# Patient Record
Sex: Female | Born: 1975
Health system: Southern US, Community
[De-identification: ages and names within clinical notes are randomized; demographics above are authoritative.]

## PROBLEM LIST (undated history)

## (undated) DIAGNOSIS — F32A Depression, unspecified: Secondary | ICD-10-CM

## (undated) DIAGNOSIS — R87619 Unspecified abnormal cytological findings in specimens from cervix uteri: Secondary | ICD-10-CM

## (undated) DIAGNOSIS — IMO0002 Reserved for concepts with insufficient information to code with codable children: Secondary | ICD-10-CM

## (undated) HISTORY — DX: Depression, unspecified: F32.A

## (undated) HISTORY — DX: Reserved for concepts with insufficient information to code with codable children: IMO0002

## (undated) HISTORY — DX: Unspecified abnormal cytological findings in specimens from cervix uteri: R87.619

---

## 2004-11-24 ENCOUNTER — Ambulatory Visit: Payer: Self-pay | Admitting: Obstetrics and Gynecology

## 2004-11-24 ENCOUNTER — Ambulatory Visit (HOSPITAL_COMMUNITY): Admission: RE | Admit: 2004-11-24 | Discharge: 2004-11-24 | Payer: Self-pay | Admitting: Obstetrics and Gynecology

## 2004-11-27 ENCOUNTER — Inpatient Hospital Stay (HOSPITAL_COMMUNITY): Admission: AD | Admit: 2004-11-27 | Discharge: 2004-11-28 | Payer: Self-pay | Admitting: Obstetrics and Gynecology

## 2006-01-27 ENCOUNTER — Other Ambulatory Visit: Admission: RE | Admit: 2006-01-27 | Discharge: 2006-01-27 | Payer: Self-pay | Admitting: Obstetrics and Gynecology

## 2007-03-12 ENCOUNTER — Other Ambulatory Visit: Admission: RE | Admit: 2007-03-12 | Discharge: 2007-03-12 | Payer: Self-pay | Admitting: Obstetrics and Gynecology

## 2007-12-17 ENCOUNTER — Encounter: Admission: RE | Admit: 2007-12-17 | Discharge: 2007-12-17 | Payer: Self-pay | Admitting: Obstetrics and Gynecology

## 2008-08-18 ENCOUNTER — Other Ambulatory Visit: Admission: RE | Admit: 2008-08-18 | Discharge: 2008-08-18 | Payer: Self-pay | Admitting: Obstetrics and Gynecology

## 2008-10-27 ENCOUNTER — Encounter: Admission: RE | Admit: 2008-10-27 | Discharge: 2008-10-27 | Payer: Self-pay | Admitting: Cardiology

## 2008-11-04 ENCOUNTER — Ambulatory Visit (HOSPITAL_COMMUNITY): Admission: RE | Admit: 2008-11-04 | Discharge: 2008-11-04 | Payer: Self-pay | Admitting: Cardiology

## 2009-10-05 ENCOUNTER — Other Ambulatory Visit: Admission: RE | Admit: 2009-10-05 | Discharge: 2009-10-05 | Payer: Self-pay | Admitting: Obstetrics and Gynecology

## 2010-07-06 ENCOUNTER — Emergency Department (HOSPITAL_COMMUNITY): Admission: EM | Admit: 2010-07-06 | Discharge: 2010-07-06 | Payer: Self-pay | Admitting: Emergency Medicine

## 2010-11-08 ENCOUNTER — Other Ambulatory Visit
Admission: RE | Admit: 2010-11-08 | Discharge: 2010-11-08 | Payer: Self-pay | Source: Home / Self Care | Admitting: Obstetrics and Gynecology

## 2010-12-18 ENCOUNTER — Encounter: Payer: Self-pay | Admitting: Obstetrics and Gynecology

## 2011-01-18 ENCOUNTER — Ambulatory Visit (HOSPITAL_COMMUNITY)
Admission: RE | Admit: 2011-01-18 | Discharge: 2011-01-18 | Disposition: A | Discharge status: Home / Self Care | Payer: PRIVATE HEALTH INSURANCE | Source: Ambulatory Visit | Attending: Cardiology | Admitting: Cardiology

## 2011-01-18 DIAGNOSIS — Z4509 Encounter for adjustment and management of other cardiac device: Secondary | ICD-10-CM | POA: Insufficient documentation

## 2011-01-18 LAB — PREGNANCY, URINE: Preg Test, Ur: NEGATIVE

## 2011-01-26 NOTE — Op Note (Signed)
  NAMEPATT, STEINHARDT               ACCOUNT NO.:  [id number]  MEDICAL RECORD NO.:  [id number]           PATIENT TYPE:  LOCATION:                                 FACILITY:  PHYSICIAN:  Ritta Slot, MD     DATE OF BIRTH:  1976/09/26  DATE OF PROCEDURE:  01/18/2011 DATE OF DISCHARGE:                              OPERATIVE REPORT   This is a loop recorder explant.  Mia Harris is a very pleasant 35 year old Caucasian female in whom I implanted a loop recorder over a year ago for syncope.  She has had no further syncope.  The loop recorder appears to be uncomfortable and she has had no more syncpoe for over 1 year.  She is therefore here for loop recorder explant.  After informed consent, the left pectoral region was prepped and draped in the usual sterile fashion.  She received 2 mg of Versed, 25 mcg of fentanyl, and 25 mg of Benadryl for IV light sedation.  She received 10 mL of 1% lidocaine for local anesthetic.  A 2-cm incision was made over her prior loop recorder site which was a keloid scar.  Dissection of the loop recorder was performed.  Loop recorder was removed.  Pocket was irrigated with 1% clindamycin solution.  The keloid was then removed by incision and blunt dissection.  The pocket was then closed with 2-0 Monocryl deep interrupted sutures and with 4-0 Monocryl skin continuous suture.  Mepitel strip was then placed over the wound followed by Steri- Strips.  No complications.  She will follow up with me in 1 week.     Ritta Slot, MD     HS/MEDQ  D:  01/18/2011  T:  01/18/2011  Job:  [id number]  Electronically Signed by Ritta Slot MD on 01/26/2011 02:28:15 PM

## 2011-02-11 LAB — GC/CHLAMYDIA PROBE AMP, GENITAL
Chlamydia, DNA Probe: NEGATIVE
GC Probe Amp, Genital: NEGATIVE

## 2011-02-11 LAB — URINALYSIS, ROUTINE W REFLEX MICROSCOPIC
Bilirubin Urine: NEGATIVE
Glucose, UA: NEGATIVE mg/dL
Ketones, ur: NEGATIVE mg/dL
Leukocytes, UA: NEGATIVE
Nitrite: NEGATIVE
Protein, ur: NEGATIVE mg/dL
Specific Gravity, Urine: 1.015 (ref 1.005–1.030)
Urobilinogen, UA: 0.2 mg/dL (ref 0.0–1.0)
pH: 6.5 (ref 5.0–8.0)

## 2011-02-11 LAB — URINE MICROSCOPIC-ADD ON

## 2011-02-11 LAB — POCT PREGNANCY, URINE: Preg Test, Ur: NEGATIVE

## 2011-02-11 LAB — WET PREP, GENITAL
Clue Cells Wet Prep HPF POC: NONE SEEN
Trich, Wet Prep: NONE SEEN
WBC, Wet Prep HPF POC: NONE SEEN
Yeast Wet Prep HPF POC: NONE SEEN

## 2011-04-15 NOTE — H&P (Signed)
Mia Harris, Mia Harris               ACCOUNT NO.:  [id number]   MEDICAL RECORD NO.:  [id number]          PATIENT TYPE:   LOCATION:                                 FACILITY:   PHYSICIAN:  Charles A. Delcambre, MDDATE OF BIRTH:  September 26, 1976   DATE OF ADMISSION:  11/30/2004  DATE OF DISCHARGE:                                HISTORY & PHYSICAL   HISTORY OF PRESENT ILLNESS:  The patient to be admitted November 30, 2004 for  induction at 41 weeks 2 days estimated gestational age.  She is a 35-year-  old para 1-0-1-1 with induction scheduled secondary to postdates.  Pregnancy  has been complicated by bacterial vaginosis treated with Clindesse, marginal  previa with resolved on subsequent ultrasound, with placenta mid position  and posterior.  Placental tip was 5 cm from the cervical os on last  ultrasound at 27 weeks.   PRENATAL LABORATORY DATA:  O positive, antibody screen negative.  VDRL  nonreactive.  Rubella immune.  Hepatitis B surface antigen negative.  HIV  nonreactive.  Urinalysis negative.  Pap negative.  GC and chlamydia  declined.  Quad screen negative.  One-hour Glucola 114.  Hemoglobin 12.3.  Group B strep negative.  Cystic fibrosis negative.   PAST MEDICAL HISTORY:  Negative.   SURGICAL HISTORY:  SVD x1.   MEDICATIONS:  Prenatal vitamins.   ALLERGIES:  PENICILLIN and CODEINE - reaction not specified.   SOCIAL HISTORY:  No tobacco, ethanol, or drug use and the patient is married  in a monogamous relationship with her husband.   FAMILY HISTORY:  Maternal grandmother - lung cancer.  Mother - GYN cancer  not specified otherwise.   REVIEW OF SYSTEMS:  General review negative.   PHYSICAL EXAMINATION:  VITAL SIGNS:  On examination today at 39 weeks 5 days  blood pressure 118/80, fetal heart rate 145, fundal height 40 cm, weight 177  pounds.  HEENT:  Grossly within normal limits.  NECK:  Supple without thyromegaly or adenopathy.  BACK:  NO CVAT.  Vertebral column nontender to  palpation.  HEART:  Regular rate and rhythm, 2/6 systolic ejection murmur left sternal  border.  BREASTS:  Symmetrical, otherwise deferred.  ABDOMEN:  Gravid, fundal height 40 cm, estimated fetal weight 7.5 to 8  pounds, vertex on Leopold's exam.  PELVIC:  Cervix soft, mid plane, 25% effaced, fingertip dilated.  EXTREMITIES:  Minimal edema bilaterally.   ASSESSMENT:  Intrauterine pregnancy, currently 39 weeks 5 days.  Planned to  be induced at 41 weeks 2 days.   PLAN:  High-dose Pitocin, a.m. admission November 30, 2004 at 0530.  All  questions were answered.  Will plan NST, BPP, AFI next week and she will see  Dr. Ashley Royalty in my absence at 41 weeks.  All questions are answered and we  will proceed as outlined.      CAD/MEDQ  D:  11/19/2004  T:  11/19/2004  Job:  [id number]

## 2011-09-02 LAB — BASIC METABOLIC PANEL
BUN: 9 mg/dL (ref 6–23)
CO2: 27 mEq/L (ref 19–32)
Calcium: 9.7 mg/dL (ref 8.4–10.5)
Chloride: 101 mEq/L (ref 96–112)
Creatinine, Ser: 0.72 mg/dL (ref 0.4–1.2)
GFR calc Af Amer: >60 mL/min (ref >60)
GFR calc non Af Amer: >60 mL/min (ref >60)
Glucose, Bld: 99 mg/dL (ref 70–99)
Potassium: 3.8 mEq/L (ref 3.5–5.1)
Sodium: 137 mEq/L (ref 135–145)

## 2011-09-02 LAB — PROTIME-INR
INR: 1 (ref 0.00–1.49)
Prothrombin Time: 13.1 seconds (ref 11.6–15.2)

## 2011-09-02 LAB — CBC
HCT: 38.3 % (ref 36.0–46.0)
Hemoglobin: 13.3 g/dL (ref 12.0–15.0)
MCHC: 34.6 g/dL (ref 30.0–36.0)
MCV: 88.6 fL (ref 78.0–100.0)
Platelets: 330 10*3/uL (ref 150–400)
RBC: 4.33 MIL/uL (ref 3.87–5.11)
RDW: 11.6 % (ref 11.5–15.5)
WBC: 6.3 10*3/uL (ref 4.0–10.5)

## 2011-09-02 LAB — HCG, SERUM, QUALITATIVE: Preg, Serum: NEGATIVE

## 2011-09-02 LAB — APTT: aPTT: 31 seconds (ref 24–37)

## 2012-04-26 ENCOUNTER — Other Ambulatory Visit: Payer: Self-pay | Admitting: Obstetrics & Gynecology

## 2012-04-26 ENCOUNTER — Other Ambulatory Visit (HOSPITAL_COMMUNITY)
Admission: RE | Admit: 2012-04-26 | Discharge: 2012-04-26 | Disposition: A | Discharge status: Home / Self Care | Payer: PRIVATE HEALTH INSURANCE | Source: Ambulatory Visit | Attending: Obstetrics & Gynecology | Admitting: Obstetrics & Gynecology

## 2012-04-26 DIAGNOSIS — Z01419 Encounter for gynecological examination (general) (routine) without abnormal findings: Secondary | ICD-10-CM | POA: Insufficient documentation

## 2013-05-28 ENCOUNTER — Encounter: Payer: Self-pay | Admitting: Obstetrics & Gynecology

## 2013-05-28 ENCOUNTER — Ambulatory Visit (INDEPENDENT_AMBULATORY_CARE_PROVIDER_SITE_OTHER): Payer: BC Managed Care – PPO | Admitting: Obstetrics & Gynecology

## 2013-05-28 VITALS — BP 120/80 | Ht 62.0 in | Wt 155.0 lb

## 2013-05-28 DIAGNOSIS — Z01419 Encounter for gynecological examination (general) (routine) without abnormal findings: Secondary | ICD-10-CM

## 2013-05-28 MED ORDER — NORGESTIMATE-ETH ESTRADIOL 0.25-35 MG-MCG PO TABS: 1.0000 | ORAL_TABLET | Freq: Every day | ORAL | Status: DC

## 2013-05-28 MED ORDER — CLINDAMYCIN PHOSPHATE 1 % EX GEL: Freq: Two times a day (BID) | CUTANEOUS | Status: DC

## 2013-05-28 NOTE — Progress Notes (Signed)
Patient ID: Mia Harris, female   DOB: 12-03-1975, 37 y.o.   MRN: [id number] Subjective:     Mia Harris is a 37 y.o. female here for a routine exam.  Patient's last menstrual period was 05/08/2013. No obstetric history on file. Current complaints: acne .  Personal health questionnaire reviewed: no.   Gynecologic History Patient's last menstrual period was 05/08/2013. Contraception: vasectomy Last Pap: 2013. Results were: normal Last mammogram: na. Results were: na  Obstetric HistoryG3P2 OB History   Grav Para Term Preterm Abortions TAB SAB Ect Mult Living                   The following portions of the patient's history were reviewed and updated as appropriate: allergies, current medications, past family history, past medical history, past social history, past surgical history and problem list.  Review of Systems  Review of Systems  Constitutional: Negative for fever, chills, weight loss, malaise/fatigue and diaphoresis.  HENT: Negative for hearing loss, ear pain, nosebleeds, congestion, sore throat, neck pain, tinnitus and ear discharge.   Eyes: Negative for blurred vision, double vision, photophobia, pain, discharge and redness.  Respiratory: Negative for cough, hemoptysis, sputum production, shortness of breath, wheezing and stridor.   Cardiovascular: Negative for chest pain, palpitations, orthopnea, claudication, leg swelling and PND.  Gastrointestinal: negative for abdominal pain. Negative for heartburn, nausea, vomiting, diarrhea, constipation, blood in stool and melena.  Genitourinary: Negative for dysuria, urgency, frequency, hematuria and flank pain.  Musculoskeletal: Negative for myalgias, back pain, joint pain and falls.  Skin: Negative for itching and rash.  Neurological: Negative for dizziness, tingling, tremors, sensory change, speech change, focal weakness, seizures, loss of consciousness, weakness and headaches.  Endo/Heme/Allergies: Negative for environmental  allergies and polydipsia. Does not bruise/bleed easily.  Psychiatric/Behavioral: Negative for depression, suicidal ideas, hallucinations, memory loss and substance abuse. The patient is not nervous/anxious and does not have insomnia.        Objective:    Physical Exam  Vitals reviewed. Constitutional: She is oriented to person, place, and time. She appears well-developed and well-nourished.  HENT:  Head: Normocephalic and atraumatic.        Right Ear: External ear normal.  Left Ear: External ear normal.  Nose: Nose normal.  Mouth/Throat: Oropharynx is clear and moist.  Eyes: Conjunctivae and EOM are normal. Pupils are equal, round, and reactive to light. Right eye exhibits no discharge. Left eye exhibits no discharge. No scleral icterus.  Neck: Normal range of motion. Neck supple. No tracheal deviation present. No thyromegaly present.  Cardiovascular: Normal rate, regular rhythm, normal heart sounds and intact distal pulses.  Exam reveals no gallop and no friction rub.   No murmur heard. Respiratory: Effort normal and breath sounds normal. No respiratory distress. She has no wheezes. She has no rales. She exhibits no tenderness.  GI: Soft. Bowel sounds are normal. She exhibits no distension and no mass. There is no tenderness. There is no rebound and no guarding.  Genitourinary:       Vulva is normal without lesions Vagina is pink moist without discharge Cervix normal in appearance and pap is done Uterus is normal size shape and contour Adnexa is negative with normal sized ovaries   Musculoskeletal: Normal range of motion. She exhibits no edema and no tenderness.  Neurological: She is alert and oriented to person, place, and time. She has normal reflexes. She displays normal reflexes. No cranial nerve deficit. She exhibits normal muscle tone. Coordination normal.  Skin: Skin is warm  and dry. No rash noted. No erythema. No pallor.  Psychiatric: She has a normal mood and affect. Her  behavior is normal. Judgment and thought content normal.       Assessment:    Healthy female exam.    Plan:    Follow up in: 1 year. Clindagel and OCP

## 2013-05-28 NOTE — Patient Instructions (Signed)

## 2013-06-03 ENCOUNTER — Ambulatory Visit: Payer: BC Managed Care – PPO | Admitting: Family Medicine

## 2013-07-24 ENCOUNTER — Other Ambulatory Visit: Payer: Self-pay | Admitting: *Deleted

## 2013-07-24 MED ORDER — NORGESTIMATE-ETH ESTRADIOL 0.25-35 MG-MCG PO TABS: 1.0000 | ORAL_TABLET | Freq: Every day | ORAL | Status: DC

## 2013-07-26 ENCOUNTER — Other Ambulatory Visit: Payer: Self-pay | Admitting: *Deleted

## 2013-08-05 ENCOUNTER — Telehealth: Payer: Self-pay | Admitting: *Deleted

## 2013-08-05 MED ORDER — NORGESTIMATE-ETH ESTRADIOL 0.25-35 MG-MCG PO TABS: 1.0000 | ORAL_TABLET | Freq: Every day | ORAL | Status: DC

## 2013-08-05 NOTE — Telephone Encounter (Signed)
sprintec refilled.

## 2014-07-11 ENCOUNTER — Encounter: Payer: Self-pay | Admitting: Family Medicine

## 2014-07-11 ENCOUNTER — Ambulatory Visit (INDEPENDENT_AMBULATORY_CARE_PROVIDER_SITE_OTHER): Payer: BC Managed Care – PPO | Admitting: Family Medicine

## 2014-07-11 VITALS — BP 110/74 | HR 78 | Temp 98.3°F | Resp 12 | Ht 64.5 in | Wt 144.0 lb

## 2014-07-11 DIAGNOSIS — R42 Dizziness and giddiness: Secondary | ICD-10-CM

## 2014-07-11 LAB — CBC WITH DIFFERENTIAL/PLATELET
Basophils Absolute: 0 10*3/uL (ref 0.0–0.1)
Basophils Relative: 0 % (ref 0–1)
EOS PCT: 1 % (ref 0–5)
Eosinophils Absolute: 0.1 10*3/uL (ref 0.0–0.7)
HCT: 38.1 % (ref 36.0–46.0)
Hemoglobin: 13 g/dL (ref 12.0–15.0)
Lymphocytes Relative: 25 % (ref 12–46)
Lymphs Abs: 1.6 10*3/uL (ref 0.7–4.0)
MCH: 29.3 pg (ref 26.0–34.0)
MCHC: 34.1 g/dL (ref 30.0–36.0)
MCV: 85.8 fL (ref 78.0–100.0)
MONOS PCT: 9 % (ref 3–12)
Monocytes Absolute: 0.6 10*3/uL (ref 0.1–1.0)
Neutro Abs: 4.1 10*3/uL (ref 1.7–7.7)
Neutrophils Relative %: 65 % (ref 43–77)
PLATELETS: 252 10*3/uL (ref 150–400)
RBC: 4.44 MIL/uL (ref 3.87–5.11)
RDW: 13.7 % (ref 11.5–15.5)
WBC: 6.3 10*3/uL (ref 4.0–10.5)

## 2014-07-11 LAB — COMPLETE METABOLIC PANEL WITH GFR
ALK PHOS: 65 U/L (ref 39–117)
ALT: 15 U/L (ref 0–35)
AST: 13 U/L (ref 0–37)
Albumin: 4.5 g/dL (ref 3.5–5.2)
BUN: 12 mg/dL (ref 6–23)
CO2: 26 mEq/L (ref 19–32)
CREATININE: 0.77 mg/dL (ref 0.50–1.10)
Calcium: 9.8 mg/dL (ref 8.4–10.5)
Chloride: 105 mEq/L (ref 96–112)
GFR, Est African American: >89 mL/min
GFR, Est Non African American: >89 mL/min
Glucose, Bld: 88 mg/dL (ref 70–99)
Potassium: 4.5 mEq/L (ref 3.5–5.3)
SODIUM: 140 meq/L (ref 135–145)
Total Bilirubin: 0.4 mg/dL (ref 0.2–1.2)
Total Protein: 7.3 g/dL (ref 6.0–8.3)

## 2014-07-11 LAB — TSH: TSH: 1.869 u[IU]/mL (ref 0.350–4.500)

## 2014-07-11 NOTE — Progress Notes (Signed)
Subjective:    Patient ID: Mia GreathouseMisty S Ayre, female    DOB: 03-01-1976, 38 y.o.   MRN: [id number]  HPI Patient is a very pleasant 38 year old white female who presents with one month of episodic disequilibrium. There may be some symptoms. She does occasionally have a rotational feel that episode. However there is no position change it seems to trigger it. The symptoms do come and go. There are no exacerbating or alleviating factors. It is definitely not related to position changes. She denies any headaches. She denies any blurred vision. She denies any neurologic changes. She does mix his short-term memory loss such as forgetting peoples names however does not seem to be severe memory loss. She denies any seizure activity or other neurologic deficits. She denies any tinnitus. She denies any hearing loss. She denies any sinus problems. I question the patient very extensively regarding cardiac risk factors. She denies any orthostatic dizziness. She denies any chest pain or shortness of breath. She denies any palpitations. She is adamant that this is not presyncope or syncope. Past Medical History  Diagnosis Date  . Abnormal Pap smear     abnormal pap   No past surgical history on file. No current outpatient prescriptions on file prior to visit.   No current facility-administered medications on file prior to visit.   Allergies  Allergen Reactions  . Penicillins Itching and Rash   History   Social History  . Marital Status: Married    Spouse Name: N/A    Number of Children: N/A  . Years of Education: N/A   Occupational History  . Not on file.   Social History Main Topics  . Smoking status: Former Games developermoker  . Smokeless tobacco: Not on file  . Alcohol Use: Not on file  . Drug Use: Not on file  . Sexual Activity: Yes   Other Topics Concern  . Not on file   Social History Narrative  . No narrative on file      Review of Systems  All other systems reviewed and are  negative.      Objective:   Physical Exam  Vitals reviewed. Constitutional: She is oriented to person, place, and time. She appears well-developed and well-nourished. No distress.  HENT:  Right Ear: External ear normal.  Left Ear: External ear normal.  Nose: Nose normal.  Mouth/Throat: Oropharynx is clear and moist. No oropharyngeal exudate.  Eyes: Conjunctivae and EOM are normal. Pupils are equal, round, and reactive to light. No scleral icterus.  Neck: Neck supple. No thyromegaly present.  Cardiovascular: Normal rate, regular rhythm, normal heart sounds and intact distal pulses.  Exam reveals no gallop and no friction rub.   No murmur heard. Pulmonary/Chest: Effort normal and breath sounds normal. No respiratory distress. She has no wheezes. She has no rales. She exhibits no tenderness.  Abdominal: Soft. Bowel sounds are normal. She exhibits no distension and no mass. There is no tenderness. There is no rebound and no guarding.  Lymphadenopathy:    She has no cervical adenopathy.  Neurological: She is alert and oriented to person, place, and time. She has normal reflexes. She displays normal reflexes. No cranial nerve deficit. She exhibits normal muscle tone. Coordination normal.  Skin: She is not diaphoretic.          Assessment & Plan:  1. Dizziness and giddiness Patient's physical exam is completely normal. I do not believe the patient is experiencing vertigo. I do not believe the patient is experiencing presyncope. I  believe the patient is experiencing disequilibrium which usually is benign. Present time I see no indication for head CT. I will start with a CBC as well as CMP and TSH. If lab work is normal I recommend clinical monitoring for the next few weeks. If symptoms persist our next visit with a CT scan of the head and also possibly a cardiac monitor to rule out cardiac arrhythmias. - CBC with Differential - COMPLETE METABOLIC PANEL WITH GFR - TSH

## 2014-08-01 ENCOUNTER — Encounter: Payer: BC Managed Care – PPO | Admitting: Family Medicine

## 2014-08-22 ENCOUNTER — Ambulatory Visit (INDEPENDENT_AMBULATORY_CARE_PROVIDER_SITE_OTHER): Payer: BC Managed Care – PPO | Admitting: Family Medicine

## 2014-08-22 ENCOUNTER — Encounter: Payer: Self-pay | Admitting: Family Medicine

## 2014-08-22 VITALS — BP 104/70 | HR 72 | Temp 98.0°F | Resp 18 | Ht 64.25 in | Wt 145.0 lb

## 2014-08-22 DIAGNOSIS — Z0000 Encounter for general adult medical examination without abnormal findings: Secondary | ICD-10-CM

## 2014-08-22 NOTE — Progress Notes (Signed)
Subjective:    Patient ID: Mia Harris, female    DOB: 30-Sep-1976, 37 y.o.   MRN: [id number]  HPI  Patient is here today for complete physical exam. The symptoms she was having back in August have improved. Still occasionally gets lightheaded and feels that she may pass out. She is not ago for tilt table test yet cardiac monitor. She believes the symptoms are improving. Otherwise she is doing well. She is scheduled to see a gynecologist for her breast exam and Pap smear. I reviewed all her lab work from August which showed a normal CBC, normal CMP, normal TSH. She has not had a fasting lipid panel. She declines a flu shot today. Past Medical History  Diagnosis Date  . Abnormal Pap smear     abnormal pap   No past surgical history on file. No current outpatient prescriptions on file prior to visit.   No current facility-administered medications on file prior to visit.   Allergies  Allergen Reactions  . Codeine Rash  . Penicillins Itching and Rash   History   Social History  . Marital Status: Married    Spouse Name: N/A    Number of Children: N/A  . Years of Education: N/A   Occupational History  . Not on file.   Social History Main Topics  . Smoking status: Former Games developer  . Smokeless tobacco: Never Used  . Alcohol Use: Yes  . Drug Use: No  . Sexual Activity: Yes   Other Topics Concern  . Not on file   Social History Narrative  . No narrative on file   No family history on file. Mom has severe COPD  Review of Systems  All other systems reviewed and are negative.      Objective:   Physical Exam  Vitals reviewed. Constitutional: She is oriented to person, place, and time. She appears well-developed and well-nourished. No distress.  HENT:  Head: Normocephalic and atraumatic.  Right Ear: External ear normal.  Left Ear: External ear normal.  Nose: Nose normal.  Mouth/Throat: Oropharynx is clear and moist. No oropharyngeal exudate.  Eyes: Conjunctivae  and EOM are normal. Pupils are equal, round, and reactive to light. Right eye exhibits no discharge. Left eye exhibits no discharge. No scleral icterus.  Neck: Normal range of motion. Neck supple. No JVD present. No tracheal deviation present. No thyromegaly present.  Cardiovascular: Normal rate, regular rhythm, normal heart sounds and intact distal pulses.  Exam reveals no gallop and no friction rub.   No murmur heard. Pulmonary/Chest: Effort normal and breath sounds normal. No stridor. No respiratory distress. She has no wheezes. She has no rales. She exhibits no tenderness.  Abdominal: Soft. Bowel sounds are normal. She exhibits no distension and no mass. There is no tenderness. There is no rebound and no guarding.  Musculoskeletal: Normal range of motion. She exhibits no edema and no tenderness.  Lymphadenopathy:    She has no cervical adenopathy.  Neurological: She is alert and oriented to person, place, and time. She has normal reflexes. She displays normal reflexes. No cranial nerve deficit. She exhibits normal muscle tone. Coordination normal.  Skin: Skin is warm. No rash noted. She is not diaphoretic. No erythema. No pallor.  Psychiatric: She has a normal mood and affect. Her behavior is normal. Judgment and thought content normal.          Assessment & Plan:  Routine general medical examination at a health care facility - Plan: Lipid panel  Patient's physical exam today is completely normal. I recommended she see her gynecologist for her breast exam as well as her Pap smear. Her lab work is excellent. I like her to her fasting for fasting lipid panel. She continues to have presyncopal episodes, I have recommended a tilt table test as well as a one-month loop recorder. Recommended a flu shot but she declined that. Otherwise her physical exam is completely normal.

## 2015-03-06 ENCOUNTER — Other Ambulatory Visit (HOSPITAL_COMMUNITY)
Admission: RE | Admit: 2015-03-06 | Discharge: 2015-03-06 | Disposition: A | Discharge status: Home / Self Care | Payer: BLUE CROSS/BLUE SHIELD | Source: Ambulatory Visit | Attending: Obstetrics & Gynecology | Admitting: Obstetrics & Gynecology

## 2015-03-06 ENCOUNTER — Ambulatory Visit (INDEPENDENT_AMBULATORY_CARE_PROVIDER_SITE_OTHER): Payer: BLUE CROSS/BLUE SHIELD | Admitting: Obstetrics & Gynecology

## 2015-03-06 ENCOUNTER — Encounter: Payer: Self-pay | Admitting: Obstetrics & Gynecology

## 2015-03-06 VITALS — BP 110/80 | HR 72 | Ht 64.0 in | Wt 143.4 lb

## 2015-03-06 DIAGNOSIS — Z01419 Encounter for gynecological examination (general) (routine) without abnormal findings: Secondary | ICD-10-CM | POA: Diagnosis not present

## 2015-03-06 DIAGNOSIS — Z1151 Encounter for screening for human papillomavirus (HPV): Secondary | ICD-10-CM | POA: Insufficient documentation

## 2015-03-06 DIAGNOSIS — N763 Subacute and chronic vulvitis: Secondary | ICD-10-CM | POA: Diagnosis not present

## 2015-03-06 MED ORDER — METRONIDAZOLE 0.75 % VA GEL: VAGINAL | Status: DC

## 2015-03-06 NOTE — Progress Notes (Signed)
Patient ID: Mia Harris, female   DOB: 17-Jan-1976, 39 y.o.   MRN: [id number] Subjective:     Mia Harris is a 39 y.o. female here for a routine exam.  Patient's last menstrual period was 02/17/2015. No obstetric history on file. Birth Control Method:  vasectomy Menstrual Calendar(currently): regular, second day painful  Current complaints: acne.   Current acute medical issues:  none   Recent Gynecologic History Patient's last menstrual period was 02/17/2015. Last Pap: 2014,  normal Last mammogram: ,    Past Medical History  Diagnosis Date  . Abnormal Pap smear     abnormal pap    History reviewed. No pertinent past surgical history.  OB History    No data available      History   Social History  . Marital Status: Married    Spouse Name: N/A  . Number of Children: N/A  . Years of Education: N/A   Social History Main Topics  . Smoking status: Former Games developer  . Smokeless tobacco: Never Used  . Alcohol Use: Yes  . Drug Use: No  . Sexual Activity: Yes   Other Topics Concern  . None   Social History Narrative    Family History  Problem Relation Age of Onset  . Cancer - Lung Maternal Grandmother   . Endometriosis Mother   . Cancer - Lung Maternal Aunt      Current outpatient prescriptions:  .  calcium carbonate 200 MG capsule, Take 600 mg by mouth daily., Disp: , Rfl:  .  Doxycycline Hyclate 150 MG TABS, Take by mouth., Disp: , Rfl:  .  Multiple Vitamin (MULTIVITAMIN) tablet, Take 1 tablet by mouth daily., Disp: , Rfl:  .  BIOTIN PO, Take 1 tablet by mouth daily., Disp: , Rfl:  .  Cyanocobalamin (B-12 PO), Take 1 tablet by mouth daily., Disp: , Rfl:  .  Omega-3 Fatty Acids (FISH OIL) 1000 MG CAPS, Take 1 capsule by mouth daily., Disp: , Rfl:   Review of Systems  Review of Systems  Constitutional: Negative for fever, chills, weight loss, malaise/fatigue and diaphoresis.  HENT: Negative for hearing loss, ear pain, nosebleeds, congestion, sore throat,  neck pain, tinnitus and ear discharge.   Eyes: Negative for blurred vision, double vision, photophobia, pain, discharge and redness.  Respiratory: Negative for cough, hemoptysis, sputum production, shortness of breath, wheezing and stridor.   Cardiovascular: Negative for chest pain, palpitations, orthopnea, claudication, leg swelling and PND.  Gastrointestinal: negative for abdominal pain. Negative for heartburn, nausea, vomiting, diarrhea, constipation, blood in stool and melena.  Genitourinary: Negative for dysuria, urgency, frequency, hematuria and flank pain.  Musculoskeletal: Negative for myalgias, back pain, joint pain and falls.  Skin: Negative for itching and rash.  Neurological: Negative for dizziness, tingling, tremors, sensory change, speech change, focal weakness, seizures, loss of consciousness, weakness and headaches.  Endo/Heme/Allergies: Negative for environmental allergies and polydipsia. Does not bruise/bleed easily.  Psychiatric/Behavioral: Negative for depression, suicidal ideas, hallucinations, memory loss and substance abuse. The patient is not nervous/anxious and does not have insomnia.        Objective:  Blood pressure 110/80, pulse 72, height  (1.626 m), weight 143 lb 6.4 oz (65.046 kg), last menstrual period 02/17/2015.   Physical Exam  Vitals reviewed. Constitutional: She is oriented to person, place, and time. She appears well-developed and well-nourished.  HENT:  Head: Normocephalic and atraumatic.        Right Ear: External ear normal.  Left Ear: External ear normal.  Nose: Nose normal.  Mouth/Throat: Oropharynx is clear and moist.  Eyes: Conjunctivae and EOM are normal. Pupils are equal, round, and reactive to light. Right eye exhibits no discharge. Left eye exhibits no discharge. No scleral icterus.  Neck: Normal range of motion. Neck supple. No tracheal deviation present. No thyromegaly present.  Cardiovascular: Normal rate, regular rhythm, normal  heart sounds and intact distal pulses.  Exam reveals no gallop and no friction rub.   No murmur heard. Respiratory: Effort normal and breath sounds normal. No respiratory distress. She has no wheezes. She has no rales. She exhibits no tenderness.  GI: Soft. Bowel sounds are normal. She exhibits no distension and no mass. There is no tenderness. There is no rebound and no guarding.  Genitourinary:  Breasts no masses skin changes or nipple changes bilaterally      Vulva is normal without lesions Vagina is pink moist with discharge, see wet prep results Cervix normal in appearance and pap is done Uterus is normal size shape and contour Adnexa is negative with normal sized ovaries   Musculoskeletal: Normal range of motion. She exhibits no edema and no tenderness.  Neurological: She is alert and oriented to person, place, and time. She has normal reflexes. She displays normal reflexes. No cranial nerve deficit. She exhibits normal muscle tone. Coordination normal.  Skin: Skin is warm and dry. No rash noted. No erythema. No pallor.  Psychiatric: She has a normal mood and affect. Her behavior is normal. Judgment and thought content normal.    Wet Prep:   A sample of vaginal discharge was obtained from the posterior fornix using a cotton swab. 2 drops of saline were placed on a slide and the cotton swab was immersed in the saline. Microscopic evaluation was performed and results were as follows:  Negative  for yeast  Positive for clue cells , consistent with Bacterial vaginosis Negative for trichomonas  Normal WBC population   Whiff test: Positive      Assessment:    Healthy female exam.   + BV Plan:    Follow up in: 1 years. metro Gel qhs x 5

## 2015-03-09 LAB — CYTOLOGY - PAP

## 2015-11-19 ENCOUNTER — Encounter: Payer: Self-pay | Admitting: Obstetrics & Gynecology

## 2015-11-19 ENCOUNTER — Ambulatory Visit (INDEPENDENT_AMBULATORY_CARE_PROVIDER_SITE_OTHER): Payer: BLUE CROSS/BLUE SHIELD | Admitting: Obstetrics & Gynecology

## 2015-11-19 VITALS — BP 124/70 | HR 68 | Ht 64.0 in | Wt 145.0 lb

## 2015-11-19 DIAGNOSIS — L700 Acne vulgaris: Secondary | ICD-10-CM

## 2015-11-19 MED ORDER — DESOGESTREL-ETHINYL ESTRADIOL 0.15-30 MG-MCG PO TABS: 1.0000 | ORAL_TABLET | Freq: Every day | ORAL | Status: DC

## 2016-01-03 NOTE — Progress Notes (Signed)
Patient ID: Mia Harris, female   DOB: [birth year], 40 y.o.   MRN: [id number]   Chief Complaint  Patient presents with  . discuss birth control for Acne    Blood pressure 124/70, pulse 68, height  (1.626 m), weight 145 lb (65.772 kg), last menstrual period 11/16/2015.   Patient presents with increasing problems with her acne Cystic on her face cephalically in the chin area Also some on the back and low in the chest Definitely dramatically worsens around the time of her period  Exam is consistent with her history  We discussed at length topical measures including keratin a lytic's moisturizers and all peroxide and of course use of birth control  She's placed on birth control pills a day and we'll see her back in 6 months for follow-up all questions were answered  Meds ordered this encounter  Medications  . desogestrel-ethinyl estradiol (APRI,EMOQUETTE,SOLIA) 0.15-30 MG-MCG tablet    Sig: Take 1 tablet by mouth daily.    Dispense:  1 Package    Refill:  11     ICD-9-CM ICD-10-CM   1. Cystic acne 706.1 L70.0       Face to face time:  15 minutes  Greater than 50% of the visit time was spent in counseling and coordination of care with the patient.  The summary and outline of the counseling and care coordination is summarized in the note above.   All questions were answered.

## 2016-01-07 ENCOUNTER — Ambulatory Visit (INDEPENDENT_AMBULATORY_CARE_PROVIDER_SITE_OTHER): Payer: BLUE CROSS/BLUE SHIELD | Admitting: Family Medicine

## 2016-01-07 ENCOUNTER — Encounter: Payer: Self-pay | Admitting: Family Medicine

## 2016-01-07 VITALS — BP 112/64 | HR 80 | Temp 98.3°F | Resp 16 | Ht 64.0 in | Wt 149.0 lb

## 2016-01-07 DIAGNOSIS — Z13220 Encounter for screening for lipoid disorders: Secondary | ICD-10-CM | POA: Diagnosis not present

## 2016-01-07 DIAGNOSIS — H8110 Benign paroxysmal vertigo, unspecified ear: Secondary | ICD-10-CM | POA: Diagnosis not present

## 2016-01-07 LAB — CBC WITH DIFFERENTIAL/PLATELET
BASOS PCT: 0 % (ref 0–1)
Basophils Absolute: 0 10*3/uL (ref 0.0–0.1)
EOS PCT: 1 % (ref 0–5)
Eosinophils Absolute: 0.1 10*3/uL (ref 0.0–0.7)
HCT: 39.6 % (ref 36.0–46.0)
HEMOGLOBIN: 13.3 g/dL (ref 12.0–15.0)
Lymphocytes Relative: 18 % (ref 12–46)
Lymphs Abs: 1 10*3/uL (ref 0.7–4.0)
MCH: 30.4 pg (ref 26.0–34.0)
MCHC: 33.6 g/dL (ref 30.0–36.0)
MCV: 90.4 fL (ref 78.0–100.0)
MPV: 10.2 fL (ref 8.6–12.4)
Monocytes Absolute: 0.7 10*3/uL (ref 0.1–1.0)
Monocytes Relative: 12 % (ref 3–12)
NEUTROS ABS: 3.9 10*3/uL (ref 1.7–7.7)
Neutrophils Relative %: 69 % (ref 43–77)
Platelets: 202 10*3/uL (ref 150–400)
RBC: 4.38 MIL/uL (ref 3.87–5.11)
RDW: 13.2 % (ref 11.5–15.5)
WBC: 5.7 10*3/uL (ref 4.0–10.5)

## 2016-01-07 LAB — COMPLETE METABOLIC PANEL WITH GFR
ALT: 10 U/L (ref 6–29)
AST: 16 U/L (ref 10–30)
Albumin: 4 g/dL (ref 3.6–5.1)
Alkaline Phosphatase: 54 U/L (ref 33–115)
BUN: 12 mg/dL (ref 7–25)
CHLORIDE: 104 mmol/L (ref 98–110)
CO2: 20 mmol/L (ref 20–31)
Calcium: 8.9 mg/dL (ref 8.6–10.2)
Creat: 0.81 mg/dL (ref 0.50–1.10)
GFR, Est African American: >89 mL/min (ref >=60)
GFR, Est Non African American: >89 mL/min (ref >=60)
GLUCOSE: 83 mg/dL (ref 70–99)
Potassium: 4.4 mmol/L (ref 3.5–5.3)
SODIUM: 139 mmol/L (ref 135–146)
Total Bilirubin: 0.5 mg/dL (ref 0.2–1.2)
Total Protein: 6.7 g/dL (ref 6.1–8.1)

## 2016-01-07 LAB — LIPID PANEL
CHOLESTEROL: 158 mg/dL (ref 125–200)
HDL: 53 mg/dL (ref >=46)
LDL CALC: 77 mg/dL (ref <130)
Total CHOL/HDL Ratio: 3 Ratio (ref <=5.0)
Triglycerides: 142 mg/dL (ref <150)
VLDL: 28 mg/dL (ref <30)

## 2016-01-07 NOTE — Progress Notes (Signed)
Subjective:    Patient ID: Mia Harris, female    DOB: 1976-09-18, 40 y.o.   MRN: [id number]  HPI 06/2014 Patient is a very pleasant 39 year old white female who presents with one month of episodic disequilibrium. There may be some symptoms. She does occasionally have a rotational feel that episode. However there is no position change it seems to trigger it. The symptoms do come and go. There are no exacerbating or alleviating factors. It is definitely not related to position changes. She denies any headaches. She denies any blurred vision. She denies any neurologic changes. She does mix his short-term memory loss such as forgetting peoples names however does not seem to be severe memory loss. She denies any seizure activity or other neurologic deficits. She denies any tinnitus. She denies any hearing loss. She denies any sinus problems. I question the patient very extensively regarding cardiac risk factors. She denies any orthostatic dizziness. She denies any chest pain or shortness of breath. She denies any palpitations. She is adamant that this is not presyncope or syncope.  AT that time, my plan was: 1. Dizziness and giddiness Patient's physical exam is completely normal. I do not believe the patient is experiencing vertigo. I do not believe the patient is experiencing presyncope. I believe the patient is experiencing disequilibrium which usually is benign. Present time I see no indication for head CT. I will start with a CBC as well as CMP and TSH. If lab work is normal I recommend clinical monitoring for the next few weeks. If symptoms persist our next visit with a CT scan of the head and also possibly a cardiac monitor to rule out cardiac arrhythmias. - CBC with Differential - COMPLETE METABOLIC PANEL WITH GFR - TSH  01/07/16  she's been doing well up until this week. This week she had 2 days of dizziness. The dizziness had a vertiginous component to it. If she turns her head rapidly looking  side to side , she would feel very lightheaded. She describes it as the room beginning to move her spend. She denies any hearing loss. She denies any tinnitus. She denies any headaches. She denies any blurry vision. She denies any neurologic deficits anywhere else in the body. She denies any neuropathic pain or numbness and tingling in her extremities. She denies any heavy bleeding. She is on birth control and she states that she cannot be pregnant. She is not taking any medications that can make her dizzy. Today she feels much better. Her exam today is completely normal.   In retrospect, she states that she has been having some mild dizziness occasionally over the last 6 months but nothing as severe as the last 2 days this week. Furthermore the spells would come and go very infrequently.   Past Medical History  Diagnosis Date  . Abnormal Pap smear     abnormal pap   No past surgical history on file. Current Outpatient Prescriptions on File Prior to Visit  Medication Sig Dispense Refill  . calcium carbonate 200 MG capsule Take 600 mg by mouth daily.    Marland Kitchen desogestrel-ethinyl estradiol (APRI,EMOQUETTE,SOLIA) 0.15-30 MG-MCG tablet Take 1 tablet by mouth daily. 1 Package 11  . Multiple Vitamin (MULTIVITAMIN) tablet Take 1 tablet by mouth daily.     No current facility-administered medications on file prior to visit.   Allergies  Allergen Reactions  . Codeine Rash  . Penicillins Itching and Rash   Social History   Social History  . Marital Status:  Married    Spouse Name: N/A  . Number of Children: N/A  . Years of Education: N/A   Occupational History  . Not on file.   Social History Main Topics  . Smoking status: Former Games developer  . Smokeless tobacco: Never Used  . Alcohol Use: Yes  . Drug Use: No  . Sexual Activity: Yes   Other Topics Concern  . Not on file   Social History Narrative      Review of Systems  All other systems reviewed and are negative.      Objective:    Physical Exam  Constitutional: She is oriented to person, place, and time. She appears well-developed and well-nourished. No distress.  HENT:  Right Ear: External ear normal.  Left Ear: External ear normal.  Nose: Nose normal.  Mouth/Throat: Oropharynx is clear and moist. No oropharyngeal exudate.  Eyes: Conjunctivae and EOM are normal. Pupils are equal, round, and reactive to light. No scleral icterus.  Neck: Neck supple. No thyromegaly present.  Cardiovascular: Normal rate, regular rhythm, normal heart sounds and intact distal pulses.  Exam reveals no gallop and no friction rub.   No murmur heard. Pulmonary/Chest: Effort normal and breath sounds normal. No respiratory distress. She has no wheezes. She has no rales. She exhibits no tenderness.  Abdominal: Soft. Bowel sounds are normal. She exhibits no distension and no mass. There is no tenderness. There is no rebound and no guarding.  Lymphadenopathy:    She has no cervical adenopathy.  Neurological: She is alert and oriented to person, place, and time. She has normal reflexes. No cranial nerve deficit. She exhibits normal muscle tone. Coordination normal.  Skin: She is not diaphoretic.  Vitals reviewed.         Assessment & Plan:  Benign paroxysmal positional vertigo, unspecified laterality - Plan: CBC with Differential/Platelet, COMPLETE METABOLIC PANEL WITH GFR  Screening cholesterol level - Plan: Lipid panel   exam today is completely reassuring. I suspect that she may be having some mild vertigo versus viral labyrinthitis. There are no abnormalities seen on examination today. Today she feels completely normal. Therefore we have elected to monitor her situation clinically. Should symptoms worsen or persist , she would like to proceed with an MRI of the brain.Marland Kitchen

## 2016-01-08 ENCOUNTER — Encounter: Payer: Self-pay | Admitting: Family Medicine

## 2016-01-19 ENCOUNTER — Other Ambulatory Visit: Payer: Self-pay | Admitting: *Deleted

## 2016-01-19 MED ORDER — DESOGESTREL-ETHINYL ESTRADIOL 0.15-30 MG-MCG PO TABS: 1.0000 | ORAL_TABLET | Freq: Every day | ORAL | Status: DC

## 2016-01-19 NOTE — Telephone Encounter (Signed)
Medication sent to pt's pharmacy with 90 day supply per Dr. Despina Hidden.

## 2016-04-15 ENCOUNTER — Telehealth: Payer: Self-pay | Admitting: Obstetrics & Gynecology

## 2016-04-15 NOTE — Telephone Encounter (Signed)
Pt states the Apri OCP is increasing her appetite/weight gain. Pt states is there an alternative?

## 2016-04-15 NOTE — Telephone Encounter (Signed)
The progesterone component of birth control pills is what sometimes can cause some weight gain, on average 3-5 pounds of fluid retention.  Apri has desogestrel as its progesterone which is a 3rd generation progesterone meaning it is causes less of this than other progesterones.  The only one that causes less is drosperinone which we do not prescribe because of its significant increase in blood clots.  So long winded way to say she is on an optimal pill for weight gain.

## 2016-04-15 NOTE — Telephone Encounter (Signed)
Pt called stating that Dr. Despina HiddenEure prescribed her a birth control medication that is making her gain weight. Pt would like to know if he could give her another birth control that she wont gain weight in. Please contact pt

## 2016-05-09 ENCOUNTER — Telehealth: Payer: Self-pay | Admitting: Obstetrics & Gynecology

## 2016-05-09 NOTE — Telephone Encounter (Addendum)
Pt called stating that she is bleeding between periods and she states that this is not normal, pt would like a call back from the nurse or Dr. Despina HiddenEure. Please contact pt. Pt given an appt with Dr. Despina HiddenEure for evaluation.

## 2016-05-12 ENCOUNTER — Encounter: Payer: Self-pay | Admitting: Obstetrics & Gynecology

## 2016-05-12 ENCOUNTER — Ambulatory Visit (INDEPENDENT_AMBULATORY_CARE_PROVIDER_SITE_OTHER): Payer: BLUE CROSS/BLUE SHIELD | Admitting: Obstetrics & Gynecology

## 2016-05-12 VITALS — BP 110/80 | HR 76 | Wt 150.0 lb

## 2016-05-12 DIAGNOSIS — N921 Excessive and frequent menstruation with irregular cycle: Secondary | ICD-10-CM

## 2016-05-12 MED ORDER — LEVONORGESTREL-ETHINYL ESTRAD 0.15-30 MG-MCG PO TABS: 11.0000 | ORAL_TABLET | Freq: Every day | ORAL | Status: DC

## 2016-05-12 MED ORDER — LEVONORGESTREL-ETHINYL ESTRAD 0.15-30 MG-MCG PO TABS: 1.0000 | ORAL_TABLET | Freq: Every day | ORAL | Status: DC

## 2016-05-12 NOTE — Progress Notes (Signed)
Patient ID: Mia GreathouseMisty S Harris, female   DOB: 09-Mar-1976, 40 y.o.   MRN: [id number]      Chief Complaint  Patient presents with  . gyn visit    BCP for several month/ having light BTB    Blood pressure 110/80, pulse 76, weight 150 lb (68.04 kg), last menstrual period 04/13/2016.  40 y.o. No obstetric history on file. Patient's last menstrual period was 04/13/2016. The current method of family planning is OCP (estrogen/progesterone).  Subjective Pt with BTB on desogestrel + 30 mics EE Started oral retinoin in October No other meds or other changes The BTB color is brown and red  Objective   Pertinent ROS No pain No bleeding with intercourse  Labs or studies     Impression Diagnoses this Encounter::   ICD-9-CM ICD-10-CM   1. Breakthrough bleeding on birth control pills 626.6 N92.1     Established relevant diagnosis(es):   Plan/Recommendations: Meds ordered this encounter  Medications  . DISCONTD: levonorgestrel-ethinyl estradiol (NORDETTE) 0.15-30 MG-MCG tablet    Sig: Take 1 tablet by mouth daily.    Dispense:  1 Package    Refill:  0  . levonorgestrel-ethinyl estradiol (NORDETTE) 0.15-30 MG-MCG tablet    Sig: Take 11 tablets by mouth daily.    Dispense:  1 Package    Refill:  11    Labs or Scans Ordered: No orders of the defined types were placed in this encounter.    Management::   Follow up Return in about 5 months (around 10/12/2016) for yearly, with Dr Despina HiddenEure.        Face to face time:  15 minutes  Greater than 50% of the visit time was spent in counseling and coordination of care with the patient.  The summary and outline of the counseling and care coordination is summarized in the note above.   All questions were answered.

## 2016-10-13 ENCOUNTER — Ambulatory Visit: Payer: BLUE CROSS/BLUE SHIELD | Admitting: Obstetrics & Gynecology

## 2016-10-31 ENCOUNTER — Ambulatory Visit: Payer: BLUE CROSS/BLUE SHIELD | Admitting: Obstetrics & Gynecology

## 2016-11-14 ENCOUNTER — Encounter: Payer: Self-pay | Admitting: Obstetrics & Gynecology

## 2016-11-14 ENCOUNTER — Other Ambulatory Visit (HOSPITAL_COMMUNITY)
Admission: RE | Admit: 2016-11-14 | Discharge: 2016-11-14 | Disposition: A | Discharge status: Home / Self Care | Payer: BLUE CROSS/BLUE SHIELD | Source: Ambulatory Visit | Attending: Obstetrics & Gynecology | Admitting: Obstetrics & Gynecology

## 2016-11-14 ENCOUNTER — Ambulatory Visit (INDEPENDENT_AMBULATORY_CARE_PROVIDER_SITE_OTHER): Payer: BLUE CROSS/BLUE SHIELD | Admitting: Obstetrics & Gynecology

## 2016-11-14 VITALS — BP 120/80 | HR 78 | Ht 63.0 in | Wt 151.0 lb

## 2016-11-14 DIAGNOSIS — Z01419 Encounter for gynecological examination (general) (routine) without abnormal findings: Secondary | ICD-10-CM

## 2016-11-14 NOTE — Progress Notes (Signed)
Subjective:     Mia Harris is a 40 y.o. female here for a routine exam.  Patient's last menstrual period was 11/02/2016. No obstetric history on file. Birth Control Method:  None Menstrual Calendar(currently): regular  Current complaints: couple of sre breast areas.   Current acute medical issues:  none   Recent Gynecologic History Patient's last menstrual period was 11/02/2016. Last Pap: 2016,  normal Last mammogram: ,    Past Medical History:  Diagnosis Date  . Abnormal Pap smear    abnormal pap    History reviewed. No pertinent surgical history.  OB History    No data available      Social History   Social History  . Marital status: Married    Spouse name: N/A  . Number of children: N/A  . Years of education: N/A   Social History Main Topics  . Smoking status: Former Games developermoker  . Smokeless tobacco: Never Used  . Alcohol use Yes  . Drug use: No  . Sexual activity: Yes   Other Topics Concern  . None   Social History Narrative  . None    Family History  Problem Relation Age of Onset  . Cancer - Lung Maternal Grandmother   . Endometriosis Mother   . Cancer - Lung Maternal Aunt      Current Outpatient Prescriptions:  .  calcium carbonate 200 MG capsule, Take 600 mg by mouth daily., Disp: , Rfl:  .  Multiple Vitamin (MULTIVITAMIN) tablet, Take 1 tablet by mouth daily., Disp: , Rfl:   Review of Systems  Review of Systems  Constitutional: Negative for fever, chills, weight loss, malaise/fatigue and diaphoresis.  HENT: Negative for hearing loss, ear pain, nosebleeds, congestion, sore throat, neck pain, tinnitus and ear discharge.   Eyes: Negative for blurred vision, double vision, photophobia, pain, discharge and redness.  Respiratory: Negative for cough, hemoptysis, sputum production, shortness of breath, wheezing and stridor.   Cardiovascular: Negative for chest pain, palpitations, orthopnea, claudication, leg swelling and PND.  Gastrointestinal:  negative for abdominal pain. Negative for heartburn, nausea, vomiting, diarrhea, constipation, blood in stool and melena.  Genitourinary: Negative for dysuria, urgency, frequency, hematuria and flank pain.  Musculoskeletal: Negative for myalgias, back pain, joint pain and falls.  Skin: Negative for itching and rash.  Neurological: Negative for dizziness, tingling, tremors, sensory change, speech change, focal weakness, seizures, loss of consciousness, weakness and headaches.  Endo/Heme/Allergies: Negative for environmental allergies and polydipsia. Does not bruise/bleed easily.  Psychiatric/Behavioral: Negative for depression, suicidal ideas, hallucinations, memory loss and substance abuse. The patient is not nervous/anxious and does not have insomnia.        Objective:  Blood pressure 120/80, pulse 78, height 5\' 3"  (1.6 m), weight 151 lb (68.5 kg), last menstrual period 11/02/2016.   Physical Exam  Vitals reviewed. Constitutional: She is oriented to person, place, and time. She appears well-developed and well-nourished.  HENT:  Head: Normocephalic and atraumatic.        Right Ear: External ear normal.  Left Ear: External ear normal.  Nose: Nose normal.  Mouth/Throat: Oropharynx is clear and moist.  Eyes: Conjunctivae and EOM are normal. Pupils are equal, round, and reactive to light. Right eye exhibits no discharge. Left eye exhibits no discharge. No scleral icterus.  Neck: Normal range of motion. Neck supple. No tracheal deviation present. No thyromegaly present.  Cardiovascular: Normal rate, regular rhythm, normal heart sounds and intact distal pulses.  Exam reveals no gallop and no friction rub.  No murmur heard. Respiratory: Effort normal and breath sounds normal. No respiratory distress. She has no wheezes. She has no rales. She exhibits no tenderness.  GI: Soft. Bowel sounds are normal. She exhibits no distension and no mass. There is no tenderness. There is no rebound and no  guarding.  Genitourinary:  Breasts generally negative, scattered areas of tenderness, chnges with her menses      Vulva is normal without lesions Vagina is pink moist without discharge Cervix normal in appearance and pap is done Uterus is normal size shape and contour Adnexa is negative with normal sized ovaries   Musculoskeletal: Normal range of motion. She exhibits no edema and no tenderness.  Neurological: She is alert and oriented to person, place, and time. She has normal reflexes. She displays normal reflexes. No cranial nerve deficit. She exhibits normal muscle tone. Coordination normal.  Skin: Skin is warm and dry. No rash noted. No erythema. No pallor.  Psychiatric: She has a normal mood and affect. Her behavior is normal. Judgment and thought content normal.       Medications Ordered at today's visit: No orders of the defined types were placed in this encounter.   Other orders placed at today's visit: No orders of the defined types were placed in this encounter.     Assessment:    Healthy female exam.    Plan:    Follow up in: 1 year. let us kow if the breast areas grow or don't chnge with menses     No Follow-up on file.

## 2016-11-17 LAB — CYTOLOGY - PAP: Diagnosis: NEGATIVE

## 2017-02-13 ENCOUNTER — Telehealth: Payer: Self-pay | Admitting: Family Medicine

## 2017-02-13 NOTE — Telephone Encounter (Signed)
Ok for scopolamine

## 2017-02-13 NOTE — Telephone Encounter (Signed)
Pt would like a motion sickness patch called in to [facility] she is going on a cruise and does not want to get sick.

## 2017-02-13 NOTE — Telephone Encounter (Signed)
OK to send in for patches? 

## 2017-02-14 MED ORDER — SCOPOLAMINE 1 MG/3DAYS TD PT72: 1.0000 | MEDICATED_PATCH | TRANSDERMAL | 1 refills | Status: DC

## 2017-02-14 NOTE — Telephone Encounter (Signed)
Medication called/sent to requested pharmacy and pt aware 

## 2017-02-14 NOTE — Addendum Note (Signed)
Addended by: Legrand RamsWILLIS, Cory Kitt B on: 02/14/2017 09:20 AM   Modules accepted: Orders

## 2017-04-18 ENCOUNTER — Encounter: Payer: Self-pay | Admitting: Family Medicine

## 2018-03-26 ENCOUNTER — Encounter: Payer: Self-pay | Admitting: Obstetrics & Gynecology

## 2018-03-26 ENCOUNTER — Ambulatory Visit (INDEPENDENT_AMBULATORY_CARE_PROVIDER_SITE_OTHER): Payer: BLUE CROSS/BLUE SHIELD | Admitting: Obstetrics & Gynecology

## 2018-03-26 ENCOUNTER — Other Ambulatory Visit (HOSPITAL_COMMUNITY)
Admission: RE | Admit: 2018-03-26 | Discharge: 2018-03-26 | Disposition: A | Discharge status: Home / Self Care | Payer: BLUE CROSS/BLUE SHIELD | Source: Ambulatory Visit | Attending: Obstetrics & Gynecology | Admitting: Obstetrics & Gynecology

## 2018-03-26 ENCOUNTER — Encounter (INDEPENDENT_AMBULATORY_CARE_PROVIDER_SITE_OTHER): Payer: Self-pay

## 2018-03-26 VITALS — BP 116/82 | HR 67 | Ht 64.0 in | Wt 156.0 lb

## 2018-03-26 DIAGNOSIS — Z01419 Encounter for gynecological examination (general) (routine) without abnormal findings: Secondary | ICD-10-CM | POA: Diagnosis not present

## 2018-03-26 NOTE — Progress Notes (Signed)
Subjective:     Mia Harris is a 42 y.o. female here for a routine exam.  Patient's last menstrual period was 02/22/2018 (approximate). [id number] Birth Control Method:  none Menstrual Calendar(currently): regular  Current complaints: none.   Current acute medical issues:     Recent Gynecologic History Patient's last menstrual period was 02/22/2018 (approximate). Last Pap: 2017,  normal Last mammogram: never,    Past Medical History:  Diagnosis Date  . Abnormal Pap smear    abnormal pap    History reviewed. No pertinent surgical history.  OB History    Gravida  3   Para  2   Term  2   Preterm      AB  1   Living  2     SAB  1   TAB      Ectopic      Multiple      Live Births  2           Social History   Socioeconomic History  . Marital status: Married    Spouse name: Not on file  . Number of children: Not on file  . Years of education: Not on file  . Highest education level: Not on file  Occupational History  . Not on file  Social Needs  . Financial resource strain: Not on file  . Food insecurity:    Worry: Not on file    Inability: Not on file  . Transportation needs:    Medical: Not on file    Non-medical: Not on file  Tobacco Use  . Smoking status: Former Smoker    Years: 10.00    Types: Cigarettes  . Smokeless tobacco: Never Used  Substance and Sexual Activity  . Alcohol use: Yes    Comment: once in a while  . Drug use: No  . Sexual activity: Yes    Birth control/protection: None  Lifestyle  . Physical activity:    Days per week: Not on file    Minutes per session: Not on file  . Stress: Not on file  Relationships  . Social connections:    Talks on phone: Not on file    Gets together: Not on file    Attends religious service: Not on file    Active member of club or organization: Not on file    Attends meetings of clubs or organizations: Not on file    Relationship status: Not on file  Other Topics Concern  . Not on  file  Social History Narrative  . Not on file    Family History  Problem Relation Age of Onset  . Cancer - Lung Maternal Grandmother   . Endometriosis Mother   . Cancer - Lung Maternal Aunt   . Asthma Daughter   . Colitis Daughter   . Seizures Son      Current Outpatient Medications:  Marland Kitchen  Multiple Vitamin (MULTIVITAMIN) tablet, Take 1 tablet by mouth daily., Disp: , Rfl:   Review of Systems  Review of Systems  Constitutional: Negative for fever, chills, weight loss, malaise/fatigue and diaphoresis.  HENT: Negative for hearing loss, ear pain, nosebleeds, congestion, sore throat, neck pain, tinnitus and ear discharge.   Eyes: Negative for blurred vision, double vision, photophobia, pain, discharge and redness.  Respiratory: Negative for cough, hemoptysis, sputum production, shortness of breath, wheezing and stridor.   Cardiovascular: Negative for chest pain, palpitations, orthopnea, claudication, leg swelling and PND.  Gastrointestinal: negative for abdominal pain. Negative for heartburn,  nausea, vomiting, diarrhea, constipation, blood in stool and melena.  Genitourinary: Negative for dysuria, urgency, frequency, hematuria and flank pain.  Musculoskeletal: Negative for myalgias, back pain, joint pain and falls.  Skin: Negative for itching and rash.  Neurological: Negative for dizziness, tingling, tremors, sensory change, speech change, focal weakness, seizures, loss of consciousness, weakness and headaches.  Endo/Heme/Allergies: Negative for environmental allergies and polydipsia. Does not bruise/bleed easily.  Psychiatric/Behavioral: Negative for depression, suicidal ideas, hallucinations, memory loss and substance abuse. The patient is not nervous/anxious and does not have insomnia.        Objective:  Blood pressure 116/82, pulse 67, height  (1.626 m), weight 156 lb (70.8 kg), last menstrual period 02/22/2018.   Physical Exam  Vitals reviewed. Constitutional: She is  oriented to person, place, and time. She appears well-developed and well-nourished.  HENT:  Head: Normocephalic and atraumatic.        Right Ear: External ear normal.  Left Ear: External ear normal.  Nose: Nose normal.  Mouth/Throat: Oropharynx is clear and moist.  Eyes: Conjunctivae and EOM are normal. Pupils are equal, round, and reactive to light. Right eye exhibits no discharge. Left eye exhibits no discharge. No scleral icterus.  Neck: Normal range of motion. Neck supple. No tracheal deviation present. No thyromegaly present.  Cardiovascular: Normal rate, regular rhythm, normal heart sounds and intact distal pulses.  Exam reveals no gallop and no friction rub.   No murmur heard. Respiratory: Effort normal and breath sounds normal. No respiratory distress. She has no wheezes. She has no rales. She exhibits no tenderness.  GI: Soft. Bowel sounds are normal. She exhibits no distension and no mass. There is no tenderness. There is no rebound and no guarding.  Genitourinary:  Breasts no masses skin changes or nipple changes bilaterally      Vulva is normal without lesions Vagina is pink moist without discharge Cervix normal in appearance and pap is done Uterus is normal size shape and contour Adnexa is negative with normal sized ovaries   Musculoskeletal: Normal range of motion. She exhibits no edema and no tenderness.  Neurological: She is alert and oriented to person, place, and time. She has normal reflexes. She displays normal reflexes. No cranial nerve deficit. She exhibits normal muscle tone. Coordination normal.  Skin: Skin is warm and dry. No rash noted. No erythema. No pallor.  Psychiatric: She has a normal mood and affect. Her behavior is normal. Judgment and thought content normal.       Medications Ordered at today's visit: No orders of the defined types were placed in this encounter.   Other orders placed at today's visit: No orders of the defined types were placed in  this encounter.     Assessment:    Healthy female exam.    Plan:    Follow up in: 2 years.     Return in about 2 years (around 03/26/2020) for yearly, with Dr Despina Hidden.

## 2018-03-28 LAB — CYTOLOGY - PAP
DIAGNOSIS: NEGATIVE
HPV (WINDOPATH): NOT DETECTED

## 2018-04-24 ENCOUNTER — Ambulatory Visit (INDEPENDENT_AMBULATORY_CARE_PROVIDER_SITE_OTHER): Payer: BLUE CROSS/BLUE SHIELD | Admitting: Family Medicine

## 2018-04-24 ENCOUNTER — Encounter: Payer: Self-pay | Admitting: Family Medicine

## 2018-04-24 VITALS — BP 120/94 | HR 70 | Temp 98.1°F | Resp 14 | Ht 64.0 in | Wt 155.0 lb

## 2018-04-24 DIAGNOSIS — R232 Flushing: Secondary | ICD-10-CM | POA: Diagnosis not present

## 2018-04-24 NOTE — Progress Notes (Signed)
Subjective:    Patient ID: Mia Harris, female    DOB: [birth year], 42 y.o.   MRN: [id number]  HPI Patient presents with a history of flushing over the last several weeks.  She states for no reason, she will feel extremely hot inside.  Her face will feel flushed.  She will not sweat.  She does not run a fever.  She would just feel like she is hot all over.  She denies any rash.  Occasionally her skin will turn red especially in her face.  She denies any weight loss.  She denies any palpitations.  She denies any sweating.  She denies any nausea or diarrhea or other symptoms of carcinoid.  She denies any abdominal pain.  She is still having regular periods.  There is no family history of early menopause.  She denies any panic attacks or anxiety.  Her blood pressure slightly elevated today at 94 diastolic.  She does admit to eating a lot of sodium and salt.  She is exercising.  She is watching her diet. Past Medical History:  Diagnosis Date  . Abnormal Pap smear    abnormal pap   No past surgical history on file. Current Outpatient Medications on File Prior to Visit  Medication Sig Dispense Refill  . Multiple Vitamin (MULTIVITAMIN) tablet Take 1 tablet by mouth daily.     No current facility-administered medications on file prior to visit.    Allergies  Allergen Reactions  . Codeine Rash  . Penicillins Itching and Rash   Social History   Socioeconomic History  . Marital status: Married    Spouse name: Not on file  . Number of children: Not on file  . Years of education: Not on file  . Highest education level: Not on file  Occupational History  . Not on file  Social Needs  . Financial resource strain: Not on file  . Food insecurity:    Worry: Not on file    Inability: Not on file  . Transportation needs:    Medical: Not on file    Non-medical: Not on file  Tobacco Use  . Smoking status: Former Smoker    Years: 10.00    Types: Cigarettes  . Smokeless tobacco: Never Used    Substance and Sexual Activity  . Alcohol use: Yes    Comment: once in a while  . Drug use: No  . Sexual activity: Yes    Birth control/protection: None  Lifestyle  . Physical activity:    Days per week: Not on file    Minutes per session: Not on file  . Stress: Not on file  Relationships  . Social connections:    Talks on phone: Not on file    Gets together: Not on file    Attends religious service: Not on file    Active member of club or organization: Not on file    Attends meetings of clubs or organizations: Not on file    Relationship status: Not on file  . Intimate partner violence:    Fear of current or ex partner: Not on file    Emotionally abused: Not on file    Physically abused: Not on file    Forced sexual activity: Not on file  Other Topics Concern  . Not on file  Social History Narrative  . Not on file      Review of Systems  All other systems reviewed and are negative.      Objective:  Physical Exam  Constitutional: She appears well-developed and well-nourished. No distress.  Neck: No thyromegaly present.  Cardiovascular: Normal rate, regular rhythm and normal heart sounds. Exam reveals no gallop and no friction rub.  No murmur heard. Pulmonary/Chest: Effort normal and breath sounds normal. No stridor. No respiratory distress. She has no wheezes. She has no rales. She exhibits no tenderness.  Abdominal: Soft. Bowel sounds are normal. She exhibits no distension and no mass. There is no tenderness. There is no rebound and no guarding.  Lymphadenopathy:    She has no cervical adenopathy.  Skin: No rash noted. She is not diaphoretic. No erythema.  Vitals reviewed.         Assessment & Plan:  Hot flashes - Plan: CBC with Differential/Platelet, COMPLETE METABOLIC PANEL WITH GFR, TSH  Patient symptoms sound like vasomotor symptoms possibly related to perimenopausal hormone changes.  I will check basic lab work today including a CBC to evaluate for  any bone marrow abnormalities along with a CMP to evaluate for any electrolyte disturbances, hyperglycemia, and a TSH to evaluate for hyperthyroidism.  I have asked the patient to monitor her temperature to ensure that she is not experiencing fever of unknown origin.  If she remains afebrile and her lab work is normal, I have recommended clinical monitoring as I believe this is likely due to hormone changes within her body as she enters her 40s.  Could consider trying black cohosh if symptoms worsen.  If she develops diarrhea or symptoms worsen, I would evaluate for flushing disorder such as carcinoid syndrome by collecting a 24-hour urine.

## 2018-04-25 LAB — COMPLETE METABOLIC PANEL WITHOUT GFR
AG Ratio: 2.1 (calc) (ref 1.0–2.5)
ALT: 16 U/L (ref 6–29)
AST: 20 U/L (ref 10–30)
Albumin: 4.9 g/dL (ref 3.6–5.1)
Alkaline phosphatase (APISO): 70 U/L (ref 33–115)
BUN: 12 mg/dL (ref 7–25)
CO2: 26 mmol/L (ref 20–32)
Calcium: 9.6 mg/dL (ref 8.6–10.2)
Chloride: 102 mmol/L (ref 98–110)
Creat: 0.96 mg/dL (ref 0.50–1.10)
GFR, Est African American: 86 mL/min/1.73m2
GFR, Est Non African American: 74 mL/min/1.73m2
Globulin: 2.3 g/dL (ref 1.9–3.7)
Glucose, Bld: 90 mg/dL (ref 65–99)
Potassium: 4.1 mmol/L (ref 3.5–5.3)
Sodium: 138 mmol/L (ref 135–146)
Total Bilirubin: 0.4 mg/dL (ref 0.2–1.2)
Total Protein: 7.2 g/dL (ref 6.1–8.1)

## 2018-04-25 LAB — CBC WITH DIFFERENTIAL/PLATELET
BASOS PCT: 0.5 %
Basophils Absolute: 44 cells/uL (ref 0–200)
Eosinophils Absolute: 104 cells/uL (ref 15–500)
Eosinophils Relative: 1.2 %
HCT: 38 % (ref 35.0–45.0)
HEMOGLOBIN: 13.1 g/dL (ref 11.7–15.5)
LYMPHS ABS: 2271 {cells}/uL (ref 850–3900)
MCH: 29.4 pg (ref 27.0–33.0)
MCHC: 34.5 g/dL (ref 32.0–36.0)
MCV: 85.2 fL (ref 80.0–100.0)
MPV: 11.3 fL (ref 7.5–12.5)
Monocytes Relative: 8.9 %
NEUTROS ABS: 5507 {cells}/uL (ref 1500–7800)
Neutrophils Relative %: 63.3 %
Platelets: 273 10*3/uL (ref 140–400)
RBC: 4.46 10*6/uL (ref 3.80–5.10)
RDW: 12.6 % (ref 11.0–15.0)
Total Lymphocyte: 26.1 %
WBC: 8.7 10*3/uL (ref 3.8–10.8)
WBCMIX: 774 {cells}/uL (ref 200–950)

## 2018-04-25 LAB — TSH: TSH: 2.23 m[IU]/L

## 2019-02-27 ENCOUNTER — Other Ambulatory Visit (HOSPITAL_COMMUNITY): Payer: Self-pay | Admitting: Obstetrics & Gynecology

## 2019-02-27 DIAGNOSIS — Z1231 Encounter for screening mammogram for malignant neoplasm of breast: Secondary | ICD-10-CM

## 2019-03-25 ENCOUNTER — Ambulatory Visit (HOSPITAL_COMMUNITY): Payer: PRIVATE HEALTH INSURANCE

## 2019-03-27 ENCOUNTER — Other Ambulatory Visit: Payer: Self-pay

## 2019-03-28 ENCOUNTER — Ambulatory Visit (INDEPENDENT_AMBULATORY_CARE_PROVIDER_SITE_OTHER): Payer: BLUE CROSS/BLUE SHIELD | Admitting: Family Medicine

## 2019-03-28 ENCOUNTER — Encounter: Payer: Self-pay | Admitting: Family Medicine

## 2019-03-28 VITALS — BP 130/76 | HR 80 | Temp 98.1°F | Resp 14 | Ht 64.0 in | Wt 159.0 lb

## 2019-03-28 DIAGNOSIS — R002 Palpitations: Secondary | ICD-10-CM

## 2019-03-28 MED ORDER — METOPROLOL SUCCINATE ER 25 MG PO TB24: 25.0000 mg | ORAL_TABLET | Freq: Every day | ORAL | 3 refills | Status: DC

## 2019-03-28 NOTE — Progress Notes (Signed)
Subjective:    Patient ID: Mia Harris, female    DOB: 1976/08/19, 43 y.o.   MRN: [id number]  HPI Patient reports several months of palpitations in her chest.  They become more frequently over the last several weeks.  Patient states that normally she feels fine however she will occasionally feel a skipped beat or a flip-flop sensation in her chest.  She will then experience a pause prior to her heart returning to a normal rhythm.  Her heart also feels like it is racing at times for several beats.  She is only drinking 1/2 cup of coffee a day.  She denies drinking any caffeinated sodas.  She is not taking any energy drinks or any kind of dietary supplements.  She denies any use of stimulants.  She is sleeping okay.  She denies any chest pain shortness of breath or dyspnea on exertion.  In fact when she exercises the symptoms seem to go away Past Medical History:  Diagnosis Date  . Abnormal Pap smear    abnormal pap   No past surgical history on file. Current Outpatient Medications on File Prior to Visit  Medication Sig Dispense Refill  . Multiple Vitamin (MULTIVITAMIN) tablet Take 1 tablet by mouth daily.     No current facility-administered medications on file prior to visit.    Allergies  Allergen Reactions  . Codeine Rash  . Penicillins Itching and Rash   Social History   Socioeconomic History  . Marital status: Married    Spouse name: Not on file  . Number of children: Not on file  . Years of education: Not on file  . Highest education level: Not on file  Occupational History  . Not on file  Social Needs  . Financial resource strain: Not on file  . Food insecurity:    Worry: Not on file    Inability: Not on file  . Transportation needs:    Medical: Not on file    Non-medical: Not on file  Tobacco Use  . Smoking status: Former Smoker    Years: 10.00    Types: Cigarettes  . Smokeless tobacco: Never Used  Substance and Sexual Activity  . Alcohol use: Yes   Comment: once in a while  . Drug use: No  . Sexual activity: Yes    Birth control/protection: None  Lifestyle  . Physical activity:    Days per week: Not on file    Minutes per session: Not on file  . Stress: Not on file  Relationships  . Social connections:    Talks on phone: Not on file    Gets together: Not on file    Attends religious service: Not on file    Active member of club or organization: Not on file    Attends meetings of clubs or organizations: Not on file    Relationship status: Not on file  . Intimate partner violence:    Fear of current or ex partner: Not on file    Emotionally abused: Not on file    Physically abused: Not on file    Forced sexual activity: Not on file  Other Topics Concern  . Not on file  Social History Narrative  . Not on file      Review of Systems  All other systems reviewed and are negative.      Objective:   Physical Exam Vitals signs reviewed.  Constitutional:      General: She is not in acute distress.  Appearance: She is well-developed. She is not diaphoretic.  Neck:     Thyroid: No thyromegaly.  Cardiovascular:     Rate and Rhythm: Normal rate and regular rhythm.     Heart sounds: Normal heart sounds. No murmur. No friction rub. No gallop.   Pulmonary:     Effort: Pulmonary effort is normal. No respiratory distress.     Breath sounds: Normal breath sounds. No stridor. No wheezing or rales.  Chest:     Chest wall: No tenderness.  Abdominal:     General: Bowel sounds are normal. There is no distension.     Palpations: Abdomen is soft. There is no mass.     Tenderness: There is no abdominal tenderness. There is no guarding or rebound.  Lymphadenopathy:     Cervical: No cervical adenopathy.  Skin:    Findings: No erythema or rash.           Assessment & Plan:  Palpitations - Plan: EKG 12-Lead, CBC with Differential/Platelet, COMPLETE METABOLIC PANEL WITH GFR, TSH  EKG today shows normal sinus rhythm with  normal intervals and a normal axis with no evidence of ischemia or infarction.  There is no sign of Wolff-Parkinson-White, Brugada syndrome, or long QT syndrome.  I will obtain a TSH to evaluate for hyperthyroidism.  Also check for electrolyte abnormalities with a CMP.  Check for signs of anemia with a CBC.  I suspect PACs and PVCs.  However I will obtain a 24-hour Holter monitor to evaluate further.  I suspect PVCs or PACs.  Obtain event monitor to rule out SVT.  Use Toprol-XL 25 mg p.o. daily to calm symptoms after the event monitor.

## 2019-03-29 LAB — CBC WITH DIFFERENTIAL/PLATELET
Absolute Monocytes: 441 cells/uL (ref 200–950)
Basophils Absolute: 17 cells/uL (ref 0–200)
Basophils Relative: 0.3 %
Eosinophils Absolute: 81 cells/uL (ref 15–500)
Eosinophils Relative: 1.4 %
HCT: 40.1 % (ref 35.0–45.0)
Hemoglobin: 13.7 g/dL (ref 11.7–15.5)
Lymphs Abs: 1235 cells/uL (ref 850–3900)
MCH: 30.5 pg (ref 27.0–33.0)
MCHC: 34.2 g/dL (ref 32.0–36.0)
MCV: 89.3 fL (ref 80.0–100.0)
MPV: 10.8 fL (ref 7.5–12.5)
Monocytes Relative: 7.6 %
Neutro Abs: 4025 cells/uL (ref 1500–7800)
Neutrophils Relative %: 69.4 %
Platelets: 269 10*3/uL (ref 140–400)
RBC: 4.49 10*6/uL (ref 3.80–5.10)
RDW: 12.5 % (ref 11.0–15.0)
Total Lymphocyte: 21.3 %
WBC: 5.8 10*3/uL (ref 3.8–10.8)

## 2019-03-29 LAB — COMPLETE METABOLIC PANEL WITH GFR
AG Ratio: 1.8 (calc) (ref 1.0–2.5)
ALT: 10 U/L (ref 6–29)
AST: 14 U/L (ref 10–30)
Albumin: 4.6 g/dL (ref 3.6–5.1)
Alkaline phosphatase (APISO): 62 U/L (ref 31–125)
BUN: 20 mg/dL (ref 7–25)
CO2: 25 mmol/L (ref 20–32)
Calcium: 9.6 mg/dL (ref 8.6–10.2)
Chloride: 106 mmol/L (ref 98–110)
Creat: 0.85 mg/dL (ref 0.50–1.10)
GFR, Est African American: 99 mL/min/{1.73_m2} (ref >=60)
GFR, Est Non African American: 85 mL/min/{1.73_m2} (ref >=60)
Globulin: 2.5 g/dL (calc) (ref 1.9–3.7)
Glucose, Bld: 91 mg/dL (ref 65–99)
Potassium: 4.9 mmol/L (ref 3.5–5.3)
Sodium: 141 mmol/L (ref 135–146)
Total Bilirubin: 0.6 mg/dL (ref 0.2–1.2)
Total Protein: 7.1 g/dL (ref 6.1–8.1)

## 2019-03-29 LAB — TSH: TSH: 2.49 mIU/L

## 2019-04-02 ENCOUNTER — Telehealth: Payer: Self-pay | Admitting: *Deleted

## 2019-04-02 NOTE — Telephone Encounter (Signed)
[employer] will mail a 3 day ZIO XT long term holter monitor to your home at a COVID 19 replacement for the 24 hour holter monitor Dr. Dennard Schaumann ordered.  Instructions reviewed briefly as they are included in the monitor kit.

## 2019-04-15 ENCOUNTER — Other Ambulatory Visit (HOSPITAL_COMMUNITY): Payer: Self-pay | Admitting: Obstetrics & Gynecology

## 2019-04-15 ENCOUNTER — Other Ambulatory Visit: Payer: Self-pay

## 2019-04-15 ENCOUNTER — Ambulatory Visit (HOSPITAL_COMMUNITY)
Admission: RE | Admit: 2019-04-15 | Discharge: 2019-04-15 | Disposition: A | Discharge status: Home / Self Care | Payer: BLUE CROSS/BLUE SHIELD | Source: Ambulatory Visit | Attending: Obstetrics & Gynecology | Admitting: Obstetrics & Gynecology

## 2019-04-15 DIAGNOSIS — Z1231 Encounter for screening mammogram for malignant neoplasm of breast: Secondary | ICD-10-CM | POA: Diagnosis not present

## 2019-04-15 DIAGNOSIS — R928 Other abnormal and inconclusive findings on diagnostic imaging of breast: Secondary | ICD-10-CM

## 2019-04-16 ENCOUNTER — Other Ambulatory Visit: Payer: Self-pay

## 2019-04-16 ENCOUNTER — Ambulatory Visit (HOSPITAL_COMMUNITY)
Admission: RE | Admit: 2019-04-16 | Discharge: 2019-04-16 | Disposition: A | Discharge status: Home / Self Care | Payer: BLUE CROSS/BLUE SHIELD | Source: Ambulatory Visit | Attending: Obstetrics & Gynecology | Admitting: Obstetrics & Gynecology

## 2019-04-16 ENCOUNTER — Ambulatory Visit (INDEPENDENT_AMBULATORY_CARE_PROVIDER_SITE_OTHER): Payer: BLUE CROSS/BLUE SHIELD

## 2019-04-16 DIAGNOSIS — N6011 Diffuse cystic mastopathy of right breast: Secondary | ICD-10-CM | POA: Diagnosis not present

## 2019-04-16 DIAGNOSIS — R928 Other abnormal and inconclusive findings on diagnostic imaging of breast: Secondary | ICD-10-CM | POA: Diagnosis not present

## 2019-04-16 DIAGNOSIS — R002 Palpitations: Secondary | ICD-10-CM | POA: Diagnosis not present

## 2019-04-29 ENCOUNTER — Other Ambulatory Visit: Payer: Self-pay

## 2019-10-29 ENCOUNTER — Other Ambulatory Visit: Payer: Self-pay | Admitting: *Deleted

## 2019-10-29 DIAGNOSIS — Z20822 Contact with and (suspected) exposure to covid-19: Secondary | ICD-10-CM

## 2019-10-31 LAB — NOVEL CORONAVIRUS, NAA: SARS-CoV-2, NAA: DETECTED — AB

## 2019-11-01 ENCOUNTER — Telehealth: Payer: Self-pay | Admitting: *Deleted

## 2019-11-01 NOTE — Telephone Encounter (Signed)
Patient returned call concerning covid results ,would like a call back.

## 2020-02-17 ENCOUNTER — Other Ambulatory Visit (HOSPITAL_COMMUNITY)
Admission: RE | Admit: 2020-02-17 | Discharge: 2020-02-17 | Disposition: A | Discharge status: Home / Self Care | Payer: BC Managed Care – PPO | Source: Ambulatory Visit | Attending: Obstetrics & Gynecology | Admitting: Obstetrics & Gynecology

## 2020-02-17 ENCOUNTER — Ambulatory Visit (INDEPENDENT_AMBULATORY_CARE_PROVIDER_SITE_OTHER): Payer: BC Managed Care – PPO | Admitting: Obstetrics & Gynecology

## 2020-02-17 ENCOUNTER — Other Ambulatory Visit: Payer: Self-pay

## 2020-02-17 ENCOUNTER — Encounter: Payer: Self-pay | Admitting: Obstetrics & Gynecology

## 2020-02-17 VITALS — BP 125/82 | HR 70 | Ht 64.0 in | Wt 162.4 lb

## 2020-02-17 DIAGNOSIS — R5383 Other fatigue: Secondary | ICD-10-CM

## 2020-02-17 DIAGNOSIS — Z01419 Encounter for gynecological examination (general) (routine) without abnormal findings: Secondary | ICD-10-CM | POA: Diagnosis not present

## 2020-02-17 DIAGNOSIS — Z1151 Encounter for screening for human papillomavirus (HPV): Secondary | ICD-10-CM | POA: Diagnosis not present

## 2020-02-17 NOTE — Progress Notes (Signed)
Subjective:     Mia Harris is a 44 y.o. female here for a routine exam.  Patient's last menstrual period was 02/03/2020. [id number] Birth Control Method:  vasectomy Menstrual Calendar(currently): regular  Current complaints: decreased energy.   Current acute medical issues:  none   Recent Gynecologic History Patient's last menstrual period was 02/03/2020. Last Pap: 2019,  normal Last mammogram: 5/20,  normal  Past Medical History:  Diagnosis Date  . Abnormal Pap smear    abnormal pap    No past surgical history on file.  OB History    Gravida  3   Para  2   Term  2   Preterm      AB  1   Living  2     SAB  1   TAB      Ectopic      Multiple      Live Births  2           Social History   Socioeconomic History  . Marital status: Married    Spouse name: Not on file  . Number of children: 2  . Years of education: Not on file  . Highest education level: Not on file  Occupational History  . Not on file  Tobacco Use  . Smoking status: Former Smoker    Years: 10.00    Types: Cigarettes  . Smokeless tobacco: Never Used  Substance and Sexual Activity  . Alcohol use: Yes    Comment: once in a while  . Drug use: No  . Sexual activity: Yes    Birth control/protection: None  Other Topics Concern  . Not on file  Social History Narrative  . Not on file   Social Determinants of Health   Financial Resource Strain:   . Difficulty of Paying Living Expenses:   Food Insecurity:   . Worried About Charity fundraiser in the Last Year:   . Arboriculturist in the Last Year:   Transportation Needs:   . Film/video editor (Medical):   Marland Kitchen Lack of Transportation (Non-Medical):   Physical Activity:   . Days of Exercise per Week:   . Minutes of Exercise per Session:   Stress:   . Feeling of Stress :   Social Connections:   . Frequency of Communication with Friends and Family:   . Frequency of Social Gatherings with Friends and Family:   . Attends  Religious Services:   . Active Member of Clubs or Organizations:   . Attends Archivist Meetings:   Marland Kitchen Marital Status:     Family History  Problem Relation Age of Onset  . Cancer - Lung Maternal Grandmother   . Endometriosis Mother   . Cancer - Lung Maternal Aunt   . Asthma Daughter   . Colitis Daughter   . Seizures Son      Current Outpatient Medications:  .  metoprolol succinate (TOPROL-XL) 25 MG 24 hr tablet, Take 1 tablet (25 mg total) by mouth daily. (Patient not taking: Reported on 02/17/2020), Disp: 30 tablet, Rfl: 3 .  Multiple Vitamin (MULTIVITAMIN) tablet, Take 1 tablet by mouth daily., Disp: , Rfl:   Review of Systems  Review of Systems  Constitutional: Negative for fever, chills, weight loss, malaise/fatigue and diaphoresis.  HENT: Negative for hearing loss, ear pain, nosebleeds, congestion, sore throat, neck pain, tinnitus and ear discharge.   Eyes: Negative for blurred vision, double vision, photophobia, pain, discharge and redness.  Respiratory:  Negative for cough, hemoptysis, sputum production, shortness of breath, wheezing and stridor.   Cardiovascular: Negative for chest pain, palpitations, orthopnea, claudication, leg swelling and PND.  Gastrointestinal: negative for abdominal pain. Negative for heartburn, nausea, vomiting, diarrhea, constipation, blood in stool and melena.  Genitourinary: Negative for dysuria, urgency, frequency, hematuria and flank pain.  Musculoskeletal: Negative for myalgias, back pain, joint pain and falls.  Skin: Negative for itching and rash.  Neurological: Negative for dizziness, tingling, tremors, sensory change, speech change, focal weakness, seizures, loss of consciousness, weakness and headaches.  Endo/Heme/Allergies: Negative for environmental allergies and polydipsia. Does not bruise/bleed easily.  Psychiatric/Behavioral: Negative for depression, suicidal ideas, hallucinations, memory loss and substance abuse. The patient is  not nervous/anxious and does not have insomnia.        Objective:  Blood pressure 125/82, pulse 70, height 5\' 4"  (1.626 m), weight 162 lb 6.4 oz (73.7 kg), last menstrual period 02/03/2020.   Physical Exam  Vitals reviewed. Constitutional: She is oriented to person, place, and time. She appears well-developed and well-nourished.  HENT:  Head: Normocephalic and atraumatic.        Right Ear: External ear normal.  Left Ear: External ear normal.  Nose: Nose normal.  Mouth/Throat: Oropharynx is clear and moist.  Eyes: Conjunctivae and EOM are normal. Pupils are equal, round, and reactive to light. Right eye exhibits no discharge. Left eye exhibits no discharge. No scleral icterus.  Neck: Normal range of motion. Neck supple. No tracheal deviation present. No thyromegaly present.  Cardiovascular: Normal rate, regular rhythm, normal heart sounds and intact distal pulses.  Exam reveals no gallop and no friction rub.   No murmur heard. Respiratory: Effort normal and breath sounds normal. No respiratory distress. She has no wheezes. She has no rales. She exhibits no tenderness.  GI: Soft. Bowel sounds are normal. She exhibits no distension and no mass. There is no tenderness. There is no rebound and no guarding.  Genitourinary:  Breasts no masses skin changes or nipple changes bilaterally      Vulva is normal without lesions Vagina is pink moist without discharge Cervix normal in appearance and pap is done Uterus is normal size shape and contour Adnexa is negative with normal sized ovaries   Musculoskeletal: Normal range of motion. She exhibits no edema and no tenderness.  Neurological: She is alert and oriented to person, place, and time. She has normal reflexes. She displays normal reflexes. No cranial nerve deficit. She exhibits normal muscle tone. Coordination normal.  Skin: Skin is warm and dry. No rash noted. No erythema. No pallor.  Psychiatric: She has a normal mood and affect. Her  behavior is normal. Judgment and thought content normal.       Medications Ordered at today's visit: No orders of the defined types were placed in this encounter.   Other orders placed at today's visit: Orders Placed This Encounter  Procedures  . TSH      Assessment:    Healthy female exam.   lethargy Plan:    Contraception: vasectomy. Follow up in: 1-3 years.   Check TSH  No follow-ups on file.

## 2020-02-20 DIAGNOSIS — R5383 Other fatigue: Secondary | ICD-10-CM | POA: Diagnosis not present

## 2020-02-20 LAB — CYTOLOGY - PAP
Comment: NEGATIVE
Diagnosis: NEGATIVE
High risk HPV: NEGATIVE

## 2020-02-21 LAB — TSH: TSH: 2.55 u[IU]/mL (ref 0.450–4.500)

## 2020-06-02 ENCOUNTER — Other Ambulatory Visit: Payer: Self-pay

## 2020-06-02 ENCOUNTER — Ambulatory Visit (INDEPENDENT_AMBULATORY_CARE_PROVIDER_SITE_OTHER): Payer: BC Managed Care – PPO

## 2020-06-02 DIAGNOSIS — Z0184 Encounter for antibody response examination: Secondary | ICD-10-CM

## 2020-06-02 DIAGNOSIS — Z111 Encounter for screening for respiratory tuberculosis: Secondary | ICD-10-CM

## 2020-06-02 DIAGNOSIS — Z23 Encounter for immunization: Secondary | ICD-10-CM | POA: Diagnosis not present

## 2020-07-06 DIAGNOSIS — Z111 Encounter for screening for respiratory tuberculosis: Secondary | ICD-10-CM | POA: Diagnosis not present

## 2020-07-06 DIAGNOSIS — Z0184 Encounter for antibody response examination: Secondary | ICD-10-CM | POA: Diagnosis not present

## 2020-08-12 ENCOUNTER — Other Ambulatory Visit (HOSPITAL_COMMUNITY): Payer: Self-pay | Admitting: Obstetrics & Gynecology

## 2020-08-12 DIAGNOSIS — Z1231 Encounter for screening mammogram for malignant neoplasm of breast: Secondary | ICD-10-CM

## 2020-08-17 ENCOUNTER — Other Ambulatory Visit: Payer: Self-pay

## 2020-08-17 ENCOUNTER — Ambulatory Visit (HOSPITAL_COMMUNITY)
Admission: RE | Admit: 2020-08-17 | Discharge: 2020-08-17 | Disposition: A | Discharge status: Home / Self Care | Payer: BC Managed Care – PPO | Source: Ambulatory Visit | Attending: Obstetrics & Gynecology | Admitting: Obstetrics & Gynecology

## 2020-08-17 DIAGNOSIS — Z1231 Encounter for screening mammogram for malignant neoplasm of breast: Secondary | ICD-10-CM | POA: Diagnosis not present

## 2021-02-18 ENCOUNTER — Other Ambulatory Visit: Payer: Self-pay

## 2021-02-18 ENCOUNTER — Encounter: Payer: Self-pay | Admitting: Obstetrics & Gynecology

## 2021-02-18 ENCOUNTER — Other Ambulatory Visit (HOSPITAL_COMMUNITY)
Admission: RE | Admit: 2021-02-18 | Discharge: 2021-02-18 | Disposition: A | Discharge status: Home / Self Care | Payer: BC Managed Care – PPO | Source: Ambulatory Visit | Attending: Obstetrics & Gynecology | Admitting: Obstetrics & Gynecology

## 2021-02-18 ENCOUNTER — Ambulatory Visit (INDEPENDENT_AMBULATORY_CARE_PROVIDER_SITE_OTHER): Payer: BC Managed Care – PPO | Admitting: Obstetrics & Gynecology

## 2021-02-18 VITALS — BP 129/89 | HR 78 | Ht 64.25 in | Wt 159.0 lb

## 2021-02-18 DIAGNOSIS — Z01419 Encounter for gynecological examination (general) (routine) without abnormal findings: Secondary | ICD-10-CM

## 2021-02-18 NOTE — Progress Notes (Signed)
Subjective:     Mia Harris is a 45 y.o. female here for a routine exam.  Patient's last menstrual period was 01/21/2021 (approximate). [id number] Birth Control Method:  vasectomy Menstrual Calendar(currently): regular/3-4 days of bleeding/moderate  Current complaints: none.   Current acute medical issues:  none   Recent Gynecologic History Patient's last menstrual period was 01/21/2021 (approximate). Last Pap: 2021,  normal Last mammogram: 08/17/20,  normal  Past Medical History:  Diagnosis Date  . Abnormal Pap smear    abnormal pap    History reviewed. No pertinent surgical history.  OB History    Gravida  3   Para  2   Term  2   Preterm      AB  1   Living  2     SAB  1   IAB      Ectopic      Multiple      Live Births  2           Social History   Socioeconomic History  . Marital status: Married    Spouse name: Not on file  . Number of children: 2  . Years of education: Not on file  . Highest education level: Not on file  Occupational History  . Not on file  Tobacco Use  . Smoking status: Former Smoker    Years: 10.00    Types: Cigarettes  . Smokeless tobacco: Never Used  Vaping Use  . Vaping Use: Never used  Substance and Sexual Activity  . Alcohol use: Yes    Comment: once in a while  . Drug use: No  . Sexual activity: Yes    Birth control/protection: Surgical    Comment: husband had vasectomy  Other Topics Concern  . Not on file  Social History Narrative  . Not on file   Social Determinants of Health   Financial Resource Strain: Low Risk   . Difficulty of Paying Living Expenses: Not hard at all  Food Insecurity: No Food Insecurity  . Worried About Programme researcher, broadcasting/film/video in the Last Year: Never true  . Ran Out of Food in the Last Year: Never true  Transportation Needs: No Transportation Needs  . Lack of Transportation (Medical): No  . Lack of Transportation (Non-Medical): No  Physical Activity: Sufficiently Active  . Days  of Exercise per Week: 5 days  . Minutes of Exercise per Session: 100 min  Stress: No Stress Concern Present  . Feeling of Stress : Only a little  Social Connections: Socially Isolated  . Frequency of Communication with Friends and Family: Once a week  . Frequency of Social Gatherings with Friends and Family: Once a week  . Attends Religious Services: Never  . Active Member of Clubs or Organizations: No  . Attends Banker Meetings: Never  . Marital Status: Married    Family History  Problem Relation Age of Onset  . Cancer - Lung Maternal Grandmother   . Endometriosis Mother   . Cancer - Lung Maternal Aunt   . Asthma Daughter   . Colitis Daughter   . Seizures Son      Current Outpatient Medications:  Marland Kitchen  Multiple Vitamin (MULTIVITAMIN) tablet, Take 1 tablet by mouth daily., Disp: , Rfl:   Review of Systems  Review of Systems  Constitutional: Negative for fever, chills, weight loss, malaise/fatigue and diaphoresis.  HENT: Negative for hearing loss, ear pain, nosebleeds, congestion, sore throat, neck pain, tinnitus and ear discharge.  Eyes: Negative for blurred vision, double vision, photophobia, pain, discharge and redness.  Respiratory: Negative for cough, hemoptysis, sputum production, shortness of breath, wheezing and stridor.   Cardiovascular: Negative for chest pain, palpitations, orthopnea, claudication, leg swelling and PND.  Gastrointestinal: negative for abdominal pain. Negative for heartburn, nausea, vomiting, diarrhea, constipation, blood in stool and melena.  Genitourinary: Negative for dysuria, urgency, frequency, hematuria and flank pain.  Musculoskeletal: Negative for myalgias, back pain, joint pain and falls.  Skin: Negative for itching and rash.  Neurological: Negative for dizziness, tingling, tremors, sensory change, speech change, focal weakness, seizures, loss of consciousness, weakness and headaches.  Endo/Heme/Allergies: Negative for  environmental allergies and polydipsia. Does not bruise/bleed easily.  Psychiatric/Behavioral: Negative for depression, suicidal ideas, hallucinations, memory loss and substance abuse. The patient is not nervous/anxious and does not have insomnia.        Objective:  Blood pressure 129/89, pulse 78, height 5' 4.25" (1.632 m), weight 159 lb (72.1 kg), last menstrual period 01/21/2021.   Physical Exam  Vitals reviewed. Constitutional: She is oriented to person, place, and time. She appears well-developed and well-nourished.  HENT:  Head: Normocephalic and atraumatic.        Right Ear: External ear normal.  Left Ear: External ear normal.  Nose: Nose normal.  Mouth/Throat: Oropharynx is clear and moist.  Eyes: Conjunctivae and EOM are normal. Pupils are equal, round, and reactive to light. Right eye exhibits no discharge. Left eye exhibits no discharge. No scleral icterus.  Neck: Normal range of motion. Neck supple. No tracheal deviation present. No thyromegaly present.  Cardiovascular: Normal rate, regular rhythm, normal heart sounds and intact distal pulses.  Exam reveals no gallop and no friction rub.   No murmur heard. Respiratory: Effort normal and breath sounds normal. No respiratory distress. She has no wheezes. She has no rales. She exhibits no tenderness.  GI: Soft. Bowel sounds are normal. She exhibits no distension and no mass. There is no tenderness. There is no rebound and no guarding.  Genitourinary:  Breasts no masses skin changes or nipple changes bilaterally      Vulva is normal without lesions Vagina is pink moist without discharge Cervix normal in appearance and pap is done Uterus is normal size shape and contour Adnexa is negative with normal sized ovaries   Musculoskeletal: Normal range of motion. She exhibits no edema and no tenderness.  Neurological: She is alert and oriented to person, place, and time. She has normal reflexes. She displays normal reflexes. No cranial  nerve deficit. She exhibits normal muscle tone. Coordination normal.  Skin: Skin is warm and dry. No rash noted. No erythema. No pallor.  Psychiatric: She has a normal mood and affect. Her behavior is normal. Judgment and thought content normal.       Medications Ordered at today's visit: No orders of the defined types were placed in this encounter.   Other orders placed at today's visit: No orders of the defined types were placed in this encounter.     Assessment:    Normal Gyn exam.    Plan:    Contraception: vasectomy. Mammogram ordered. Follow up in: 1 year. pt desires yearly HPV based cytology due to history of high grade dysplasia at age 64     Return in about 1 year (around 02/18/2022).

## 2021-02-22 LAB — CYTOLOGY - PAP
Comment: NEGATIVE
Diagnosis: NEGATIVE
High risk HPV: NEGATIVE

## 2021-06-01 ENCOUNTER — Other Ambulatory Visit: Payer: Self-pay

## 2021-06-01 ENCOUNTER — Encounter: Payer: Self-pay | Admitting: Family Medicine

## 2021-06-01 ENCOUNTER — Ambulatory Visit (INDEPENDENT_AMBULATORY_CARE_PROVIDER_SITE_OTHER): Payer: BC Managed Care – PPO | Admitting: Family Medicine

## 2021-06-01 VITALS — BP 118/66 | HR 82 | Temp 98.9°F | Resp 14 | Ht 64.0 in | Wt 158.0 lb

## 2021-06-01 DIAGNOSIS — H8110 Benign paroxysmal vertigo, unspecified ear: Secondary | ICD-10-CM | POA: Diagnosis not present

## 2021-06-01 NOTE — Progress Notes (Signed)
Subjective:    Patient ID: Mia Harris, female    DOB: Mar 09, 1976, 45 y.o.   MRN: [id number]  HPI Patient is a very pleasant 45 year old female who presents today with vertigo.  She states that since Sunday, she has been experiencing vertigo.  It happened suddenly without provocation.  She states that now whenever she turns her head or lays down or moves, the room will start to spin and she will feel extremely dizzy and nauseated.  If she sits completely still the nausea and dizziness will go away.  She denies any headaches.  She denies any neurologic deficits.  She denies any hearing loss.  She denies any tinnitus.  She denies any fevers or chills or neck stiffness.  She denies any blurry vision or double vision.  She denies any photophobia or phonophobia. Past Medical History:  Diagnosis Date   Abnormal Pap smear    abnormal pap   History reviewed. No pertinent surgical history. Current Outpatient Medications on File Prior to Visit  Medication Sig Dispense Refill   Multiple Vitamin (MULTIVITAMIN) tablet Take 1 tablet by mouth daily.     No current facility-administered medications on file prior to visit.   Allergies  Allergen Reactions   Codeine Rash   Penicillins Itching and Rash   Social History   Socioeconomic History   Marital status: Married    Spouse name: Not on file   Number of children: 2   Years of education: Not on file   Highest education level: Not on file  Occupational History   Not on file  Tobacco Use   Smoking status: Former    Years: 10.00    Pack years: 0.00    Types: Cigarettes   Smokeless tobacco: Never  Vaping Use   Vaping Use: Never used  Substance and Sexual Activity   Alcohol use: Yes    Comment: once in a while   Drug use: No   Sexual activity: Yes    Birth control/protection: Surgical    Comment: husband had vasectomy  Other Topics Concern   Not on file  Social History Narrative   Not on file   Social Determinants of Health    Financial Resource Strain: Low Risk    Difficulty of Paying Living Expenses: Not hard at all  Food Insecurity: No Food Insecurity   Worried About Programme researcher, broadcasting/film/video in the Last Year: Never true   Ran Out of Food in the Last Year: Never true  Transportation Needs: No Transportation Needs   Lack of Transportation (Medical): No   Lack of Transportation (Non-Medical): No  Physical Activity: Sufficiently Active   Days of Exercise per Week: 5 days   Minutes of Exercise per Session: 100 min  Stress: No Stress Concern Present   Feeling of Stress : Only a little  Social Connections: Socially Isolated   Frequency of Communication with Friends and Family: Once a week   Frequency of Social Gatherings with Friends and Family: Once a week   Attends Religious Services: Never   Database administrator or Organizations: No   Attends Engineer, structural: Never   Marital Status: Married  Catering manager Violence: Not At Risk   Fear of Current or Ex-Partner: No   Emotionally Abused: No   Physically Abused: No   Sexually Abused: No      Review of Systems     Objective:   Physical Exam Constitutional:      Appearance: Normal appearance. She  is normal weight.  HENT:     Right Ear: Tympanic membrane and ear canal normal.     Left Ear: Tympanic membrane and ear canal normal.  Eyes:     Extraocular Movements: Extraocular movements intact.     Pupils: Pupils are equal, round, and reactive to light.  Cardiovascular:     Rate and Rhythm: Normal rate and regular rhythm.     Pulses: Normal pulses.     Heart sounds: Normal heart sounds. No murmur heard.   No friction rub. No gallop.  Pulmonary:     Effort: Pulmonary effort is normal. No respiratory distress.     Breath sounds: Normal breath sounds. No stridor. No wheezing, rhonchi or rales.  Abdominal:     General: Bowel sounds are normal.     Palpations: Abdomen is soft.  Neurological:     General: No focal deficit present.      Mental Status: She is alert and oriented to person, place, and time. Mental status is at baseline.     Cranial Nerves: No cranial nerve deficit.     Sensory: No sensory deficit.     Motor: No weakness.     Coordination: Coordination normal.     Gait: Gait normal.     Deep Tendon Reflexes: Reflexes normal.          Assessment & Plan:  Benign paroxysmal positional vertigo, unspecified laterality Patient can use meclizine as needed for vertigo.  However I recommended Epley maneuvers and I printed her out some information on how to perform them.  I believe that with particle repositioning maneuvers, she will be able to cause the vertigo to subside.

## 2021-06-03 ENCOUNTER — Other Ambulatory Visit: Payer: Self-pay

## 2021-06-03 ENCOUNTER — Encounter: Payer: Self-pay | Admitting: Family Medicine

## 2021-06-03 ENCOUNTER — Ambulatory Visit (INDEPENDENT_AMBULATORY_CARE_PROVIDER_SITE_OTHER): Payer: BC Managed Care – PPO | Admitting: Family Medicine

## 2021-06-03 VITALS — BP 102/68 | HR 72 | Temp 98.1°F | Resp 14 | Ht 64.0 in | Wt 157.0 lb

## 2021-06-03 DIAGNOSIS — Z0000 Encounter for general adult medical examination without abnormal findings: Secondary | ICD-10-CM | POA: Diagnosis not present

## 2021-06-03 LAB — COMPLETE METABOLIC PANEL WITH GFR
AG Ratio: 1.9 (calc) (ref 1.0–2.5)
ALT: 18 U/L (ref 6–29)
AST: 21 U/L (ref 10–30)
Albumin: 4.5 g/dL (ref 3.6–5.1)
Alkaline phosphatase (APISO): 63 U/L (ref 31–125)
BUN: 11 mg/dL (ref 7–25)
CO2: 26 mmol/L (ref 20–32)
Calcium: 9.4 mg/dL (ref 8.6–10.2)
Chloride: 106 mmol/L (ref 98–110)
Creat: 0.77 mg/dL (ref 0.50–1.10)
GFR, Est African American: 110 mL/min/{1.73_m2} (ref >=60)
GFR, Est Non African American: 95 mL/min/{1.73_m2} (ref >=60)
Globulin: 2.4 g/dL (calc) (ref 1.9–3.7)
Glucose, Bld: 90 mg/dL (ref 65–99)
Potassium: 5 mmol/L (ref 3.5–5.3)
Sodium: 140 mmol/L (ref 135–146)
Total Bilirubin: 0.5 mg/dL (ref 0.2–1.2)
Total Protein: 6.9 g/dL (ref 6.1–8.1)

## 2021-06-03 LAB — CBC WITH DIFFERENTIAL/PLATELET
Absolute Monocytes: 456 cells/uL (ref 200–950)
Basophils Absolute: 30 cells/uL (ref 0–200)
Basophils Relative: 0.4 %
Eosinophils Absolute: 152 cells/uL (ref 15–500)
Eosinophils Relative: 2 %
HCT: 39 % (ref 35.0–45.0)
Hemoglobin: 12.8 g/dL (ref 11.7–15.5)
Lymphs Abs: 1436 cells/uL (ref 850–3900)
MCH: 28.6 pg (ref 27.0–33.0)
MCHC: 32.8 g/dL (ref 32.0–36.0)
MCV: 87.1 fL (ref 80.0–100.0)
MPV: 10.3 fL (ref 7.5–12.5)
Monocytes Relative: 6 %
Neutro Abs: 5525 cells/uL (ref 1500–7800)
Neutrophils Relative %: 72.7 %
Platelets: 277 10*3/uL (ref 140–400)
RBC: 4.48 10*6/uL (ref 3.80–5.10)
RDW: 13.6 % (ref 11.0–15.0)
Total Lymphocyte: 18.9 %
WBC: 7.6 10*3/uL (ref 3.8–10.8)

## 2021-06-03 LAB — LIPID PANEL
Cholesterol: 195 mg/dL (ref <200)
HDL: 67 mg/dL (ref >=50)
LDL Cholesterol (Calc): 108 mg/dL (calc) — ABNORMAL HIGH
Non-HDL Cholesterol (Calc): 128 mg/dL (calc) (ref <130)
Total CHOL/HDL Ratio: 2.9 (calc) (ref <5.0)
Triglycerides: 100 mg/dL (ref <150)

## 2021-06-03 NOTE — Progress Notes (Signed)
Subjective:    Patient ID: Mia Harris, female    DOB: [birth year], 45 y.o.   MRN: [id number]  HPI  Patient is here today for complete physical exam.  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  She has trouble focusing.  She states that when she was in school "she partied a lot" so she is not sure she had trouble focusing back then or if it was due to lack of effort.  However she states that she can become easily distracted.  She has a hard time maintaining focus during conversations.  She has a difficult time listening.  She avoids activities that require sustained mental effort.  She is even noticed some mild memory issues but she believes that this is due to not focusing or concentrating and therefore she forgets certain things.  She denies any mania or anxiety or depression that may confound the issue.  At the present time I do not feel that this rises to the level to require medication however she is concerned that when she goes back to school it could be an issue.  Together we have decided just to wait and see how she does before making any adjustments.  She is recently seen her gynecologist who performed her Pap smear.  Her mammogram is due in September.  Her last mammogram was normal in 2021.  She has no family history of colon cancer and therefore does not require colonoscopy until age 28. No past surgical history on file. No current outpatient prescriptions on file prior to visit.   No current facility-administered medications on file prior to visit.   Allergies  Allergen Reactions   Codeine Rash   Penicillins Itching and Rash   History   Social History   Marital Status: Married    Spouse Name: N/A    Number of Children: N/A   Years of Education: N/A   Occupational History   Not on file.   Social History Main Topics   Smoking status: Former Smoker   Smokeless tobacco: Never Used   Alcohol Use: Yes   Drug Use: No   Sexual Activity:  Yes   Other Topics Concern   Not on file   Social History Narrative   No narrative on file   No family history on file. Mom has severe COPD  Review of Systems  All other systems reviewed and are negative.     Objective:   Physical Exam Vitals reviewed.  Constitutional:      General: She is not in acute distress.    Appearance: Normal appearance. She is normal weight. She is not ill-appearing, toxic-appearing or diaphoretic.  HENT:     Head: Normocephalic and atraumatic.     Right Ear: Tympanic membrane, ear canal and external ear normal.     Left Ear: Tympanic membrane, ear canal and external ear normal.     Nose: Nose normal. No congestion or rhinorrhea.     Mouth/Throat:     Mouth: Mucous membranes are moist.     Pharynx: Oropharynx is clear. No oropharyngeal exudate or posterior oropharyngeal erythema.  Eyes:     General:        Right eye: No discharge.        Left eye: No discharge.     Extraocular Movements: Extraocular movements intact.     Conjunctiva/sclera: Conjunctivae normal.     Pupils: Pupils are equal, round, and reactive to light.  Neck:  Vascular: No carotid bruit.  Cardiovascular:     Rate and Rhythm: Normal rate and regular rhythm.     Pulses: Normal pulses.     Heart sounds: Normal heart sounds. No murmur heard.   No friction rub. No gallop.  Pulmonary:     Effort: Pulmonary effort is normal. No respiratory distress.     Breath sounds: Normal breath sounds. No stridor. No wheezing, rhonchi or rales.  Chest:     Chest wall: No tenderness.  Abdominal:     General: Abdomen is flat. Bowel sounds are normal. There is no distension.     Palpations: Abdomen is soft. There is no mass.     Tenderness: There is no abdominal tenderness. There is no right CVA tenderness, left CVA tenderness, guarding or rebound.     Hernia: No hernia is present.  Musculoskeletal:     Cervical back: Normal range of motion and neck supple. No rigidity or tenderness.      Right lower leg: No edema.     Left lower leg: No edema.  Lymphadenopathy:     Cervical: No cervical adenopathy.  Skin:    Coloration: Skin is not jaundiced or pale.     Findings: No bruising, erythema, lesion or rash.  Neurological:     General: No focal deficit present.     Mental Status: She is alert and oriented to person, place, and time. Mental status is at baseline.     Cranial Nerves: No cranial nerve deficit.     Sensory: No sensory deficit.     Motor: No weakness.     Coordination: Coordination normal.     Gait: Gait normal.     Deep Tendon Reflexes: Reflexes normal.  Psychiatric:        Mood and Affect: Mood normal.        Thought Content: Thought content normal.        Judgment: Judgment normal.          Assessment & Plan:  Routine general medical examination at a health care facility - Plan: Lipid panel Patient sees her gynecologist for her mammogram and Pap smear.  We will defer to them for this.  Her blood pressure today is excellent.  Her physical exam today is completely normal.  I will check a CBC, CMP, fasting lipid panel.  Recommended a COVID-vaccine as well as a flu shot this fall.  Tetanus shot is up-to-date.  She does have some features of ADD however I do not feel that she rises to the severity to require a clinical diagnosis and medication at the present time.  We will wait to see her response during school this fall.

## 2021-10-07 ENCOUNTER — Encounter: Payer: Self-pay | Admitting: Family Medicine

## 2021-10-07 ENCOUNTER — Other Ambulatory Visit: Payer: Self-pay

## 2021-10-07 ENCOUNTER — Ambulatory Visit (INDEPENDENT_AMBULATORY_CARE_PROVIDER_SITE_OTHER): Payer: BC Managed Care – PPO | Admitting: Family Medicine

## 2021-10-07 VITALS — BP 128/98 | HR 84 | Temp 97.0°F | Resp 18 | Ht 64.0 in | Wt 161.0 lb

## 2021-10-07 DIAGNOSIS — Z1329 Encounter for screening for other suspected endocrine disorder: Secondary | ICD-10-CM | POA: Diagnosis not present

## 2021-10-07 DIAGNOSIS — R232 Flushing: Secondary | ICD-10-CM

## 2021-10-07 NOTE — Progress Notes (Signed)
Subjective:    Patient ID: Mia Harris, female    DOB: Apr 07, 1976, 45 y.o.   MRN: [id number]  HPI Patient is being seen today for more consultation.  She thinks that she may be entering menopause.  She states that over the last several months, she has had irregular periods.  She states that she will frequently skip a period.  The following month, her period will be heavier and last 7 to 8 days.  She is also noticed some hot flashes and mood swings.  Her husband has had a vasectomy.  Therefore she does not use any other form of birth control.  She is not on any type of hormone replacement.  She is interested in counseling for anxiety and depression. Past Medical History:  Diagnosis Date   Abnormal Pap smear    abnormal pap   History reviewed. No pertinent surgical history. Current Outpatient Medications on File Prior to Visit  Medication Sig Dispense Refill   Calcium-Vitamin D-Vitamin K (VIACTIV PO) Take by mouth.     Multiple Vitamin (MULTIVITAMIN) tablet Take 1 tablet by mouth daily.     tranexamic acid (LYSTEDA) 650 MG TABS tablet Take 325 mg by mouth 2 (two) times daily.     No current facility-administered medications on file prior to visit.   Allergies  Allergen Reactions   Codeine Rash   Penicillins Itching and Rash   Social History   Socioeconomic History   Marital status: Married    Spouse name: Not on file   Number of children: 2   Years of education: Not on file   Highest education level: Not on file  Occupational History   Not on file  Tobacco Use   Smoking status: Former    Years: 10.00    Types: Cigarettes   Smokeless tobacco: Never  Vaping Use   Vaping Use: Never used  Substance and Sexual Activity   Alcohol use: Yes    Comment: once in a while   Drug use: No   Sexual activity: Yes    Birth control/protection: Surgical    Comment: husband had vasectomy  Other Topics Concern   Not on file  Social History Narrative   Not on file   Social  Determinants of Health   Financial Resource Strain: Low Risk    Difficulty of Paying Living Expenses: Not hard at all  Food Insecurity: No Food Insecurity   Worried About Programme researcher, broadcasting/film/video in the Last Year: Never true   Ran Out of Food in the Last Year: Never true  Transportation Needs: No Transportation Needs   Lack of Transportation (Medical): No   Lack of Transportation (Non-Medical): No  Physical Activity: Sufficiently Active   Days of Exercise per Week: 5 days   Minutes of Exercise per Session: 100 min  Stress: No Stress Concern Present   Feeling of Stress : Only a little  Social Connections: Socially Isolated   Frequency of Communication with Friends and Family: Once a week   Frequency of Social Gatherings with Friends and Family: Once a week   Attends Religious Services: Never   Database administrator or Organizations: No   Attends Engineer, structural: Never   Marital Status: Married  Catering manager Violence: Not At Risk   Fear of Current or Ex-Partner: No   Emotionally Abused: No   Physically Abused: No   Sexually Abused: No      Review of Systems     Objective:  Physical Exam Constitutional:      Appearance: Normal appearance. She is normal weight.  HENT:     Right Ear: Tympanic membrane and ear canal normal.     Left Ear: Tympanic membrane and ear canal normal.  Eyes:     Extraocular Movements: Extraocular movements intact.     Pupils: Pupils are equal, round, and reactive to light.  Cardiovascular:     Rate and Rhythm: Normal rate and regular rhythm.     Pulses: Normal pulses.     Heart sounds: Normal heart sounds. No murmur heard.   No friction rub. No gallop.  Pulmonary:     Effort: Pulmonary effort is normal. No respiratory distress.     Breath sounds: Normal breath sounds. No stridor. No wheezing, rhonchi or rales.  Abdominal:     General: Bowel sounds are normal.     Palpations: Abdomen is soft.  Neurological:     General: No  focal deficit present.     Mental Status: She is alert and oriented to person, place, and time. Mental status is at baseline.     Cranial Nerves: No cranial nerve deficit.     Sensory: No sensory deficit.     Motor: No weakness.     Coordination: Coordination normal.     Gait: Gait normal.     Deep Tendon Reflexes: Reflexes normal.          Assessment & Plan:  Hot flashes - Plan: TSH Given the hot flashes I will check a TSH.  However I feel that the majority of the patient's symptoms are perimenopausal.  Given the fact she still having menstrual cycles, I do not feel that checking an LH and FSH will be beneficial.  Patient declines any type of hormone replacement or birth control to help regulate her cycles.  We discussed treatment for mood swings and she is interested in seeing a psychologist for counseling.  I made a recommendation regarding who to see.  She will let me know if they need a referral.

## 2021-10-08 LAB — TSH: TSH: 2.33 mIU/L

## 2021-11-09 ENCOUNTER — Other Ambulatory Visit (HOSPITAL_COMMUNITY): Payer: Self-pay | Admitting: Family Medicine

## 2021-11-09 DIAGNOSIS — Z1231 Encounter for screening mammogram for malignant neoplasm of breast: Secondary | ICD-10-CM

## 2021-11-18 ENCOUNTER — Other Ambulatory Visit: Payer: Self-pay

## 2021-11-18 ENCOUNTER — Ambulatory Visit (HOSPITAL_COMMUNITY)
Admission: RE | Admit: 2021-11-18 | Discharge: 2021-11-18 | Disposition: A | Discharge status: Home / Self Care | Payer: BC Managed Care – PPO | Source: Ambulatory Visit | Attending: Family Medicine | Admitting: Family Medicine

## 2021-11-18 DIAGNOSIS — Z1231 Encounter for screening mammogram for malignant neoplasm of breast: Secondary | ICD-10-CM | POA: Insufficient documentation

## 2021-11-19 ENCOUNTER — Other Ambulatory Visit (HOSPITAL_COMMUNITY): Payer: Self-pay | Admitting: Family Medicine

## 2021-11-19 DIAGNOSIS — R928 Other abnormal and inconclusive findings on diagnostic imaging of breast: Secondary | ICD-10-CM

## 2021-11-30 ENCOUNTER — Ambulatory Visit (HOSPITAL_COMMUNITY)
Admission: RE | Admit: 2021-11-30 | Discharge: 2021-11-30 | Disposition: A | Discharge status: Home / Self Care | Payer: BC Managed Care – PPO | Source: Ambulatory Visit | Attending: Family Medicine | Admitting: Family Medicine

## 2021-11-30 ENCOUNTER — Other Ambulatory Visit: Payer: Self-pay

## 2021-11-30 DIAGNOSIS — N6489 Other specified disorders of breast: Secondary | ICD-10-CM | POA: Diagnosis not present

## 2021-11-30 DIAGNOSIS — R928 Other abnormal and inconclusive findings on diagnostic imaging of breast: Secondary | ICD-10-CM | POA: Diagnosis not present

## 2021-11-30 DIAGNOSIS — R922 Inconclusive mammogram: Secondary | ICD-10-CM | POA: Diagnosis not present

## 2021-12-21 ENCOUNTER — Encounter (HOSPITAL_COMMUNITY): Payer: BC Managed Care – PPO

## 2021-12-21 ENCOUNTER — Other Ambulatory Visit (HOSPITAL_COMMUNITY): Payer: BC Managed Care – PPO

## 2022-01-24 ENCOUNTER — Encounter: Payer: Self-pay | Admitting: Family Medicine

## 2022-08-23 ENCOUNTER — Encounter: Payer: BC Managed Care – PPO | Admitting: Family Medicine

## 2022-09-22 ENCOUNTER — Ambulatory Visit: Payer: BC Managed Care – PPO | Admitting: Obstetrics & Gynecology

## 2022-09-22 ENCOUNTER — Encounter: Payer: Self-pay | Admitting: Obstetrics & Gynecology

## 2022-09-22 VITALS — BP 139/89 | HR 86

## 2022-09-22 DIAGNOSIS — N644 Mastodynia: Secondary | ICD-10-CM | POA: Diagnosis not present

## 2022-09-22 NOTE — Progress Notes (Signed)
Chief Complaint  Patient presents with   Breast Problem      46 y.o. [id number] No LMP recorded. (Menstrual status: Irregular Periods). The current method of family planning is na.  Outpatient Encounter Medications as of 09/22/2022  Medication Sig   Multiple Vitamin (MULTIVITAMIN) tablet Take 1 tablet by mouth daily.   Omega-3 Fatty Acids (FISH OIL) 1000 MG CAPS Take by mouth.   Calcium-Vitamin D-Vitamin K (VIACTIV PO) Take by mouth.   tranexamic acid (LYSTEDA) 650 MG TABS tablet Take 325 mg by mouth 2 (two) times daily. (Patient not taking: Reported on 09/22/2022)   No facility-administered encounter medications on file as of 09/22/2022.    Subjective Pt has had an area of tenderness the past several weeks in left breast Not really a mass per se according to pt Has history of dense breast tissue based on 11/2021 mammogram screen No discharge or skin changes noted by patient Past Medical History:  Diagnosis Date   Abnormal Pap smear    abnormal pap    No past surgical history on file.  OB History     Gravida  3   Para  2   Term  2   Preterm      AB  1   Living  2      SAB  1   IAB      Ectopic      Multiple      Live Births  2           Allergies  Allergen Reactions   Codeine Rash   Penicillins Itching and Rash    Social History   Socioeconomic History   Marital status: Married    Spouse name: Not on file   Number of children: 2   Years of education: Not on file   Highest education level: Not on file  Occupational History   Not on file  Tobacco Use   Smoking status: Former    Years: 10    Types: Cigarettes   Smokeless tobacco: Never  Vaping Use   Vaping Use: Never used  Substance and Sexual Activity   Alcohol use: Yes    Comment: once in a while   Drug use: No   Sexual activity: Yes    Birth control/protection: Surgical    Comment: husband had vasectomy  Other Topics Concern   Not on file  Social History Narrative    Not on file   Social Determinants of Health   Financial Resource Strain: Low Risk  (10/31/2022)   Overall Financial Resource Strain (CARDIA)    Difficulty of Paying Living Expenses: Not hard at all  Food Insecurity: No Food Insecurity (10/31/2022)   Hunger Vital Sign    Worried About Running Out of Food in the Last Year: Never true    Ran Out of Food in the Last Year: Never true  Transportation Needs: No Transportation Needs (10/31/2022)   PRAPARE - Administrator, Civil Service (Medical): No    Lack of Transportation (Non-Medical): No  Physical Activity: Sufficiently Active (02/18/2021)   Exercise Vital Sign    Days of Exercise per Week: 5 days    Minutes of Exercise per Session: 100 min  Stress: No Stress Concern Present (10/31/2022)   [employer] of Occupational Health - Occupational Stress Questionnaire    Feeling of Stress : Only a little  Social Connections: Moderately Integrated (10/31/2022)   Social Connection and Isolation Panel [NHANES]  Frequency of Communication with Friends and Family: Three times a week    Frequency of Social Gatherings with Friends and Family: Not on file    Attends Religious Services: More than 4 times per year    Active Member of [employer] or Organizations: No    Attends Engineer, structural: Never    Marital Status: Married    Family History  Problem Relation Age of Onset   Cancer - Lung Maternal Grandmother    Endometriosis Mother    Cancer - Lung Maternal Aunt    Asthma Daughter    Colitis Daughter    Seizures Son     Medications:       Current Outpatient Medications:    Multiple Vitamin (MULTIVITAMIN) tablet, Take 1 tablet by mouth daily., Disp: , Rfl:    Omega-3 Fatty Acids (FISH OIL) 1000 MG CAPS, Take by mouth., Disp: , Rfl:    b complex vitamins capsule, Take 1 capsule by mouth daily., Disp: , Rfl:    Calcium-Vitamin D-Vitamin K (VIACTIV PO), Take by mouth., Disp: , Rfl:    Conj Estrogens-Bazedoxifene 0.45-20  MG TABS, Take 1 tablet by mouth daily. Take 1 tablet daily, Disp: 30 tablet, Rfl: 11   EVENING PRIMROSE OIL PO, Take by mouth., Disp: , Rfl:    tranexamic acid (LYSTEDA) 650 MG TABS tablet, Take 325 mg by mouth 2 (two) times daily. (Patient not taking: Reported on 09/22/2022), Disp: , Rfl:   Objective Blood pressure 139/89, pulse 86.  Upper outer quadrant with triangular shaped density tenderness consistent with fibroglandual rdensity, no circumscribed mass is identified Nipple is normal no other masses palpable Axilla negative  Pertinent ROS No burning with urination, frequency or urgency No nausea, vomiting or diarrhea Nor fever chills or other constitutional symptoms   Labs or studies Reviewed previous mammogram    Impression + Management Plan: Diagnoses this Encounter::   ICD-10-CM   1. Mastodynia of left breast  N64.4 MM DIAG BREAST TOMO UNI LEFT    US BREAST LTD UNI LEFT INC AXILLA    US BREAST LTD UNI LEFT INC AXILLA    MM DIAG BREAST TOMO UNI LEFT   noticed the last few weeks, history of dense fibroglandular, scheduled for Dx mammo 10/05/22        Medications prescribed during  this encounter: No orders of the defined types were placed in this encounter.   Labs or Scans Ordered during this encounter: Orders Placed This Encounter  Procedures   MM DIAG BREAST TOMO UNI LEFT   US BREAST LTD UNI LEFT INC AXILLA      Follow up Return if symptoms worsen or fail to improve, for based on imaging findings.

## 2022-10-05 ENCOUNTER — Ambulatory Visit (HOSPITAL_COMMUNITY)
Admission: RE | Admit: 2022-10-05 | Discharge: 2022-10-05 | Disposition: A | Discharge status: Home / Self Care | Payer: BC Managed Care – PPO | Source: Ambulatory Visit | Attending: Obstetrics & Gynecology | Admitting: Obstetrics & Gynecology

## 2022-10-05 DIAGNOSIS — N644 Mastodynia: Secondary | ICD-10-CM | POA: Insufficient documentation

## 2022-10-25 ENCOUNTER — Other Ambulatory Visit (HOSPITAL_COMMUNITY): Payer: Self-pay | Admitting: Obstetrics & Gynecology

## 2022-10-25 DIAGNOSIS — Z1231 Encounter for screening mammogram for malignant neoplasm of breast: Secondary | ICD-10-CM

## 2022-10-31 ENCOUNTER — Ambulatory Visit (INDEPENDENT_AMBULATORY_CARE_PROVIDER_SITE_OTHER): Payer: BC Managed Care – PPO | Admitting: Obstetrics & Gynecology

## 2022-10-31 ENCOUNTER — Other Ambulatory Visit (HOSPITAL_COMMUNITY)
Admission: RE | Admit: 2022-10-31 | Discharge: 2022-10-31 | Disposition: A | Discharge status: Home / Self Care | Payer: BC Managed Care – PPO | Source: Ambulatory Visit | Attending: Obstetrics & Gynecology | Admitting: Obstetrics & Gynecology

## 2022-10-31 ENCOUNTER — Encounter: Payer: Self-pay | Admitting: Obstetrics & Gynecology

## 2022-10-31 VITALS — BP 136/79 | HR 41 | Ht 64.0 in | Wt 159.0 lb

## 2022-10-31 DIAGNOSIS — N951 Menopausal and female climacteric states: Secondary | ICD-10-CM | POA: Diagnosis not present

## 2022-10-31 DIAGNOSIS — Z01419 Encounter for gynecological examination (general) (routine) without abnormal findings: Secondary | ICD-10-CM

## 2022-10-31 MED ORDER — CONJ ESTROGENS-BAZEDOXIFENE 0.45-20 MG PO TABS: 1.0000 | ORAL_TABLET | Freq: Every day | ORAL | 11 refills | Status: DC

## 2022-10-31 NOTE — Progress Notes (Signed)
Subjective:     Mia Harris is a 46 y.o. female here for a routine exam.  No LMP recorded. (Menstrual status: Irregular Periods). [id number] Birth Control Method:  Vasectomy Menstrual Calendar(currently): irregular  Current complaints: VMS, mood lability, irregular menses, sleep disturbance.   Current acute medical issues:  none   Recent Gynecologic History No LMP recorded. (Menstrual status: Irregular Periods). Last Pap: 2022,  normal Last mammogram: 2023,  normal  Past Medical History:  Diagnosis Date   Abnormal Pap smear    abnormal pap    History reviewed. No pertinent surgical history.  OB History     Gravida  3   Para  2   Term  2   Preterm      AB  1   Living  2      SAB  1   IAB      Ectopic      Multiple      Live Births  2           Social History   Socioeconomic History   Marital status: Married    Spouse name: Not on file   Number of children: 2   Years of education: Not on file   Highest education level: Not on file  Occupational History   Not on file  Tobacco Use   Smoking status: Former    Years: 10.00    Types: Cigarettes   Smokeless tobacco: Never  Vaping Use   Vaping Use: Never used  Substance and Sexual Activity   Alcohol use: Yes    Comment: once in a while   Drug use: No   Sexual activity: Yes    Birth control/protection: Surgical    Comment: husband had vasectomy  Other Topics Concern   Not on file  Social History Narrative   Not on file   Social Determinants of Health   Financial Resource Strain: Low Risk  (10/31/2022)   Overall Financial Resource Strain (CARDIA)    Difficulty of Paying Living Expenses: Not hard at all  Food Insecurity: No Food Insecurity (10/31/2022)   Hunger Vital Sign    Worried About Running Out of Food in the Last Year: Never true    Ran Out of Food in the Last Year: Never true  Transportation Needs: No Transportation Needs (10/31/2022)   PRAPARE - Scientist, research (physical sciences) (Medical): No    Lack of Transportation (Non-Medical): No  Physical Activity: Sufficiently Active (02/18/2021)   Exercise Vital Sign    Days of Exercise per Week: 5 days    Minutes of Exercise per Session: 100 min  Stress: No Stress Concern Present (10/31/2022)   [employer] of Occupational Health - Occupational Stress Questionnaire    Feeling of Stress : Only a little  Social Connections: Moderately Integrated (10/31/2022)   Social Connection and Isolation Panel [NHANES]    Frequency of Communication with Friends and Family: Three times a week    Frequency of Social Gatherings with Friends and Family: Not on file    Attends Religious Services: More than 4 times per year    Active Member of [employer] or Organizations: No    Attends Banker Meetings: Never    Marital Status: Married    Family History  Problem Relation Age of Onset   Cancer - Lung Maternal Grandmother    Endometriosis Mother    Cancer - Lung Maternal Aunt    Asthma Daughter    Colitis  Daughter    Seizures Son      Current Outpatient Medications:    b complex vitamins capsule, Take 1 capsule by mouth daily., Disp: , Rfl:    Calcium-Vitamin D-Vitamin K (VIACTIV PO), Take by mouth., Disp: , Rfl:    Conj Estrogens-Bazedoxifene 0.45-20 MG TABS, Take 1 tablet by mouth daily. Take 1 tablet daily, Disp: 30 tablet, Rfl: 11   EVENING PRIMROSE OIL PO, Take by mouth., Disp: , Rfl:    Multiple Vitamin (MULTIVITAMIN) tablet, Take 1 tablet by mouth daily., Disp: , Rfl:    Omega-3 Fatty Acids (FISH OIL) 1000 MG CAPS, Take by mouth., Disp: , Rfl:    tranexamic acid (LYSTEDA) 650 MG TABS tablet, Take 325 mg by mouth 2 (two) times daily. (Patient not taking: Reported on 09/22/2022), Disp: , Rfl:   Review of Systems  Review of Systems  Constitutional: Negative for fever, chills, weight loss, malaise/fatigue and diaphoresis.  HENT: Negative for hearing loss, ear pain, nosebleeds, congestion, sore  throat, neck pain, tinnitus and ear discharge.   Eyes: Negative for blurred vision, double vision, photophobia, pain, discharge and redness.  Respiratory: Negative for cough, hemoptysis, sputum production, shortness of breath, wheezing and stridor.   Cardiovascular: Negative for chest pain, palpitations, orthopnea, claudication, leg swelling and PND.  Gastrointestinal: negative for abdominal pain. Negative for heartburn, nausea, vomiting, diarrhea, constipation, blood in stool and melena.  Genitourinary: Negative for dysuria, urgency, frequency, hematuria and flank pain.  Musculoskeletal: Negative for myalgias, back pain, joint pain and falls.  Skin: Negative for itching and rash.  Neurological: Negative for dizziness, tingling, tremors, sensory change, speech change, focal weakness, seizures, loss of consciousness, weakness and headaches.  Endo/Heme/Allergies: Negative for environmental allergies and polydipsia. Does not bruise/bleed easily.  Psychiatric/Behavioral: Negative for depression, suicidal ideas, hallucinations, memory loss and substance abuse. The patient is not nervous/anxious and does not have insomnia.        Objective:  Blood pressure 136/79, pulse (!) 41, height 5\' 4"  (1.626 m), weight 159 lb (72.1 kg).   Physical Exam  Vitals reviewed. Constitutional: She is oriented to person, place, and time. She appears well-developed and well-nourished.  HENT:  Head: Normocephalic and atraumatic.        Right Ear: External ear normal.  Left Ear: External ear normal.  Nose: Nose normal.  Mouth/Throat: Oropharynx is clear and moist.  Eyes: Conjunctivae and EOM are normal. Pupils are equal, round, and reactive to light. Right eye exhibits no discharge. Left eye exhibits no discharge. No scleral icterus.  Neck: Normal range of motion. Neck supple. No tracheal deviation present. No thyromegaly present.  Cardiovascular: Normal rate, regular rhythm, normal heart sounds and intact distal  pulses.  Exam reveals no gallop and no friction rub.   No murmur heard. Respiratory: Effort normal and breath sounds normal. No respiratory distress. She has no wheezes. She has no rales. She exhibits no tenderness.  GI: Soft. Bowel sounds are normal. She exhibits no distension and no mass. There is no tenderness. There is no rebound and no guarding.  Genitourinary:  Breasts no masses skin changes or nipple changes bilaterally      Vulva is normal without lesions Vagina is pink moist without discharge Cervix normal in appearance and pap is done Uterus is normal size shape and contour Adnexa is negative with normal sized ovaries   Musculoskeletal: Normal range of motion. She exhibits no edema and no tenderness.  Neurological: She is alert and oriented to person, place, and time. She  has normal reflexes. She displays normal reflexes. No cranial nerve deficit. She exhibits normal muscle tone. Coordination normal.  Skin: Skin is warm and dry. No rash noted. No erythema. No pallor.  Psychiatric: She has a normal mood and affect. Her behavior is normal. Judgment and thought content normal.       Medications Ordered at today's visit: Meds ordered this encounter  Medications   Conj Estrogens-Bazedoxifene 0.45-20 MG TABS    Sig: Take 1 tablet by mouth daily. Take 1 tablet daily    Dispense:  30 tablet    Refill:  11    Other orders placed at today's visit: No orders of the defined types were placed in this encounter.     Assessment:    Normal Gyn exam.   PeriMenopausal symptoms Plan:    Contraception: vasectomy. Hormone replacement therapy: hormone replacement therapy: Begin Duavee. Follow up in: 3 months.     Return in about 3 months (around 01/30/2023) for Follow up, with Dr Despina Hidden.

## 2022-10-31 NOTE — Addendum Note (Signed)
Addended by: Moss Mc on: 10/31/2022 03:02 PM   Modules accepted: Orders

## 2022-11-03 LAB — CYTOLOGY - PAP
Comment: NEGATIVE
Diagnosis: NEGATIVE
High risk HPV: NEGATIVE

## 2023-01-19 ENCOUNTER — Ambulatory Visit: Payer: BC Managed Care – PPO | Admitting: Obstetrics & Gynecology

## 2023-01-30 ENCOUNTER — Ambulatory Visit: Payer: BC Managed Care – PPO | Admitting: Obstetrics & Gynecology

## 2023-05-18 ENCOUNTER — Other Ambulatory Visit: Payer: BC Managed Care – PPO

## 2023-05-18 DIAGNOSIS — Z0184 Encounter for antibody response examination: Secondary | ICD-10-CM | POA: Diagnosis not present

## 2023-05-18 DIAGNOSIS — Z111 Encounter for screening for respiratory tuberculosis: Secondary | ICD-10-CM

## 2023-05-19 LAB — MEASLES/MUMPS/RUBELLA IMMUNITY
Mumps IgG: 30.4 AU/mL
Rubella: 2.85 Index
Rubeola IgG: >300 AU/mL

## 2023-05-19 LAB — HEPATITIS B SURFACE ANTIBODY, QUANTITATIVE: Hep B S AB Quant (Post): 110 m[IU]/mL (ref >=10)

## 2023-05-19 LAB — VARICELLA ZOSTER ANTIBODY, IGG: Varicella IgG: 1746 index

## 2023-05-20 LAB — QUANTIFERON-TB GOLD PLUS
Mitogen-NIL: >10 IU/mL
NIL: 0.02 IU/mL
QuantiFERON-TB Gold Plus: NEGATIVE
TB1-NIL: 0 IU/mL
TB2-NIL: 0.01 IU/mL

## 2023-07-20 ENCOUNTER — Ambulatory Visit (HOSPITAL_COMMUNITY)
Admission: RE | Admit: 2023-07-20 | Discharge: 2023-07-20 | Disposition: A | Discharge status: Home / Self Care | Payer: BC Managed Care – PPO | Source: Ambulatory Visit | Attending: Obstetrics & Gynecology | Admitting: Obstetrics & Gynecology

## 2023-07-20 ENCOUNTER — Encounter (HOSPITAL_COMMUNITY): Payer: Self-pay

## 2023-07-20 DIAGNOSIS — Z1231 Encounter for screening mammogram for malignant neoplasm of breast: Secondary | ICD-10-CM | POA: Diagnosis not present

## 2023-08-12 DIAGNOSIS — R030 Elevated blood-pressure reading, without diagnosis of hypertension: Secondary | ICD-10-CM | POA: Diagnosis not present

## 2023-08-12 DIAGNOSIS — E663 Overweight: Secondary | ICD-10-CM | POA: Diagnosis not present

## 2023-08-12 DIAGNOSIS — Z6827 Body mass index (BMI) 27.0-27.9, adult: Secondary | ICD-10-CM | POA: Diagnosis not present

## 2023-08-12 DIAGNOSIS — S93402A Sprain of unspecified ligament of left ankle, initial encounter: Secondary | ICD-10-CM | POA: Diagnosis not present

## 2023-08-13 DIAGNOSIS — S93402A Sprain of unspecified ligament of left ankle, initial encounter: Secondary | ICD-10-CM | POA: Diagnosis not present

## 2023-08-18 DIAGNOSIS — S93402A Sprain of unspecified ligament of left ankle, initial encounter: Secondary | ICD-10-CM | POA: Diagnosis not present

## 2023-08-18 DIAGNOSIS — S8265XA Nondisplaced fracture of lateral malleolus of left fibula, initial encounter for closed fracture: Secondary | ICD-10-CM | POA: Diagnosis not present

## 2023-08-18 DIAGNOSIS — S8262XA Displaced fracture of lateral malleolus of left fibula, initial encounter for closed fracture: Secondary | ICD-10-CM | POA: Insufficient documentation

## 2023-08-21 ENCOUNTER — Other Ambulatory Visit: Payer: BC Managed Care – PPO

## 2023-08-21 DIAGNOSIS — Z13220 Encounter for screening for lipoid disorders: Secondary | ICD-10-CM | POA: Diagnosis not present

## 2023-08-21 DIAGNOSIS — R5383 Other fatigue: Secondary | ICD-10-CM | POA: Diagnosis not present

## 2023-08-22 LAB — CBC WITH DIFFERENTIAL/PLATELET
Absolute Monocytes: 448 cells/uL (ref 200–950)
Basophils Absolute: 47 cells/uL (ref 0–200)
Basophils Relative: 0.8 %
Eosinophils Absolute: 159 cells/uL (ref 15–500)
Eosinophils Relative: 2.7 %
HCT: 40.3 % (ref 35.0–45.0)
Hemoglobin: 13.7 g/dL (ref 11.7–15.5)
Lymphs Abs: 1870 cells/uL (ref 850–3900)
MCH: 31 pg (ref 27.0–33.0)
MCHC: 34 g/dL (ref 32.0–36.0)
MCV: 91.2 fL (ref 80.0–100.0)
MPV: 10.2 fL (ref 7.5–12.5)
Monocytes Relative: 7.6 %
Neutro Abs: 3375 cells/uL (ref 1500–7800)
Neutrophils Relative %: 57.2 %
Platelets: 268 10*3/uL (ref 140–400)
RBC: 4.42 10*6/uL (ref 3.80–5.10)
RDW: 11.7 % (ref 11.0–15.0)
Total Lymphocyte: 31.7 %
WBC: 5.9 10*3/uL (ref 3.8–10.8)

## 2023-08-22 LAB — LIPID PANEL
Cholesterol: 181 mg/dL (ref <200)
HDL: 50 mg/dL (ref >=50)
LDL Cholesterol (Calc): 105 mg/dL (calc) — ABNORMAL HIGH
Non-HDL Cholesterol (Calc): 131 mg/dL (calc) — ABNORMAL HIGH (ref <130)
Total CHOL/HDL Ratio: 3.6 (calc) (ref <5.0)
Triglycerides: 148 mg/dL (ref <150)

## 2023-08-22 LAB — COMPLETE METABOLIC PANEL WITH GFR
AG Ratio: 1.8 (calc) (ref 1.0–2.5)
ALT: 10 U/L (ref 6–29)
AST: 15 U/L (ref 10–35)
Albumin: 4.4 g/dL (ref 3.6–5.1)
Alkaline phosphatase (APISO): 62 U/L (ref 31–125)
BUN: 11 mg/dL (ref 7–25)
CO2: 27 mmol/L (ref 20–32)
Calcium: 9.3 mg/dL (ref 8.6–10.2)
Chloride: 105 mmol/L (ref 98–110)
Creat: 0.75 mg/dL (ref 0.50–0.99)
Globulin: 2.4 g/dL (calc) (ref 1.9–3.7)
Glucose, Bld: 89 mg/dL (ref 65–99)
Potassium: 4.7 mmol/L (ref 3.5–5.3)
Sodium: 141 mmol/L (ref 135–146)
Total Bilirubin: 0.4 mg/dL (ref 0.2–1.2)
Total Protein: 6.8 g/dL (ref 6.1–8.1)
eGFR: 100 mL/min/{1.73_m2} (ref >=60)

## 2023-08-24 ENCOUNTER — Ambulatory Visit (INDEPENDENT_AMBULATORY_CARE_PROVIDER_SITE_OTHER): Payer: BC Managed Care – PPO | Admitting: Family Medicine

## 2023-08-24 ENCOUNTER — Encounter: Payer: Self-pay | Admitting: Family Medicine

## 2023-08-24 VITALS — BP 132/86 | HR 79 | Temp 97.9°F | Ht 64.0 in | Wt 160.4 lb

## 2023-08-24 DIAGNOSIS — Z0000 Encounter for general adult medical examination without abnormal findings: Secondary | ICD-10-CM

## 2023-08-24 DIAGNOSIS — Z1211 Encounter for screening for malignant neoplasm of colon: Secondary | ICD-10-CM

## 2023-08-24 NOTE — Progress Notes (Signed)
Subjective:    Patient ID: Mia Harris, female    DOB: 04-14-1976, 47 y.o.   MRN: [id number]  HPI Patient is a 47 year old Caucasian female requesting assistance with weight loss.  However her BMI is only mildly elevated at 27.  Patient only wants something to help jumpstart her weight.  We discussed her options.  Phentermine may be a good option for her.  However she is also interested in Ozempic.  Otherwise she is doing well.  She denies any concerns.  She is dealing with hot flashes related to perimenopause.  She is not taking the medication prescribed by her gynecologist.  She declines a flu shot.  She declines RSV.  Her tetanus shot is up-to-date.  Her most recent lab work is listed below.  Her mammogram was performed in August and is normal.  Her gynecologist is performing her Pap smears. Mammogram 07/20/23 Lab on 08/21/2023  Component Date Value Ref Range Status   WBC 08/21/2023 5.9  3.8 - 10.8 Thousand/uL Final   RBC 08/21/2023 4.42  3.80 - 5.10 Million/uL Final   Hemoglobin 08/21/2023 13.7  11.7 - 15.5 g/dL Final   HCT 02/54/2706 40.3  35.0 - 45.0 % Final   MCV 08/21/2023 91.2  80.0 - 100.0 fL Final   MCH 08/21/2023 31.0  27.0 - 33.0 pg Final   MCHC 08/21/2023 34.0  32.0 - 36.0 g/dL Final   RDW 23/76/2831 11.7  11.0 - 15.0 % Final   Platelets 08/21/2023 268  140 - 400 Thousand/uL Final   MPV 08/21/2023 10.2  7.5 - 12.5 fL Final   Neutro Abs 08/21/2023 3,375  1,500 - 7,800 cells/uL Final   Lymphs Abs 08/21/2023 1,870  850 - 3,900 cells/uL Final   Absolute Monocytes 08/21/2023 448  200 - 950 cells/uL Final   Eosinophils Absolute 08/21/2023 159  15 - 500 cells/uL Final   Basophils Absolute 08/21/2023 47  0 - 200 cells/uL Final   Neutrophils Relative % 08/21/2023 57.2  % Final   Total Lymphocyte 08/21/2023 31.7  % Final   Monocytes Relative 08/21/2023 7.6  % Final   Eosinophils Relative 08/21/2023 2.7  % Final   Basophils Relative 08/21/2023 0.8  % Final   Glucose, Bld  08/21/2023 89  65 - 99 mg/dL Final   Comment: .            Fasting reference interval .    BUN 08/21/2023 11  7 - 25 mg/dL Final   Creat 51/76/1607 0.75  0.50 - 0.99 mg/dL Final   eGFR 37/08/6268 100  > OR = 60 mL/min/1.58m2 Final   BUN/Creatinine Ratio 08/21/2023 SEE NOTE:  6 - 22 (calc) Final   Comment:    Not Reported: BUN and Creatinine are within    reference range. .    Sodium 08/21/2023 141  135 - 146 mmol/L Final   Potassium 08/21/2023 4.7  3.5 - 5.3 mmol/L Final   Chloride 08/21/2023 105  98 - 110 mmol/L Final   CO2 08/21/2023 27  20 - 32 mmol/L Final   Calcium 08/21/2023 9.3  8.6 - 10.2 mg/dL Final   Total Protein 48/54/6270 6.8  6.1 - 8.1 g/dL Final   Albumin 35/00/9381 4.4  3.6 - 5.1 g/dL Final   Globulin 82/99/3716 2.4  1.9 - 3.7 g/dL (calc) Final   AG Ratio 08/21/2023 1.8  1.0 - 2.5 (calc) Final   Total Bilirubin 08/21/2023 0.4  0.2 - 1.2 mg/dL Final   Alkaline phosphatase (APISO) 08/21/2023  62  31 - 125 U/L Final   AST 08/21/2023 15  10 - 35 U/L Final   ALT 08/21/2023 10  6 - 29 U/L Final   Cholesterol 08/21/2023 181  <200 mg/dL Final   HDL 19/14/7829 50  > OR = 50 mg/dL Final   Triglycerides 56/21/3086 148  <150 mg/dL Final   LDL Cholesterol (Calc) 08/21/2023 105 (H)  mg/dL (calc) Final   Comment: Reference range: <100 . Desirable range <100 mg/dL for primary prevention;   <70 mg/dL for patients with CHD or diabetic patients  with > or = 2 CHD risk factors. Marland Kitchen LDL-C is now calculated using the Martin-Hopkins  calculation, which is a validated novel method providing  better accuracy than the Friedewald equation in the  estimation of LDL-C.  Horald Pollen et al. Lenox Ahr. 5784;696(29): 2061-2068  (http://education.[facility].com/faq/FAQ164)    Total CHOL/HDL Ratio 08/21/2023 3.6  <5.2 (calc) Final   Non-HDL Cholesterol (Calc) 08/21/2023 131 (H)  <130 mg/dL (calc) Final   Comment: For patients with diabetes plus 1 major ASCVD risk  factor, treating to a  non-HDL-C goal of <100 mg/dL  (LDL-C of <84 mg/dL) is considered a therapeutic  option.     Past Medical History:  Diagnosis Date   Abnormal Pap smear    abnormal pap   No past surgical history on file. Current Outpatient Medications on File Prior to Visit  Medication Sig Dispense Refill   b complex vitamins capsule Take 1 capsule by mouth daily.     Calcium-Vitamin D-Vitamin K (VIACTIV PO) Take by mouth.     Conj Estrogens-Bazedoxifene 0.45-20 MG TABS Take 1 tablet by mouth daily. Take 1 tablet daily 30 tablet 11   EVENING PRIMROSE OIL PO Take by mouth.     Multiple Vitamin (MULTIVITAMIN) tablet Take 1 tablet by mouth daily.     Omega-3 Fatty Acids (FISH OIL) 1000 MG CAPS Take by mouth.     tranexamic acid (LYSTEDA) 650 MG TABS tablet Take 325 mg by mouth 2 (two) times daily. (Patient not taking: Reported on 09/22/2022)     No current facility-administered medications on file prior to visit.   Allergies  Allergen Reactions   Codeine Rash   Penicillins Itching and Rash   Social History   Socioeconomic History   Marital status: Married    Spouse name: Not on file   Number of children: 2   Years of education: Not on file   Highest education level: Not on file  Occupational History   Not on file  Tobacco Use   Smoking status: Former    Types: Cigarettes   Smokeless tobacco: Never  Vaping Use   Vaping status: Never Used  Substance and Sexual Activity   Alcohol use: Yes    Comment: once in a while   Drug use: No   Sexual activity: Yes    Birth control/protection: Surgical    Comment: husband had vasectomy  Other Topics Concern   Not on file  Social History Narrative   Not on file   Social Determinants of Health   Financial Resource Strain: Low Risk  (10/31/2022)   Overall Financial Resource Strain (CARDIA)    Difficulty of Paying Living Expenses: Not hard at all  Food Insecurity: No Food Insecurity (10/31/2022)   Hunger Vital Sign    Worried About Running Out  of Food in the Last Year: Never true    Ran Out of Food in the Last Year: Never true  Transportation Needs: No  Transportation Needs (10/31/2022)   PRAPARE - Administrator, Civil Service (Medical): No    Lack of Transportation (Non-Medical): No  Physical Activity: Sufficiently Active (02/18/2021)   Exercise Vital Sign    Days of Exercise per Week: 5 days    Minutes of Exercise per Session: 100 min  Stress: No Stress Concern Present (10/31/2022)   [employer] of Occupational Health - Occupational Stress Questionnaire    Feeling of Stress : Only a little  Social Connections: Moderately Integrated (10/31/2022)   Social Connection and Isolation Panel [NHANES]    Frequency of Communication with Friends and Family: Three times a week    Frequency of Social Gatherings with Friends and Family: Not on file    Attends Religious Services: More than 4 times per year    Active Member of [employer] or Organizations: No    Attends Banker Meetings: Never    Marital Status: Married  Catering manager Violence: Not At Risk (10/31/2022)   Humiliation, Afraid, Rape, and Kick questionnaire    Fear of Current or Ex-Partner: No    Emotionally Abused: No    Physically Abused: No    Sexually Abused: No      Review of Systems     Objective:   Physical Exam Constitutional:      Appearance: Normal appearance. She is normal weight.  HENT:     Right Ear: Tympanic membrane and ear canal normal.     Left Ear: Tympanic membrane and ear canal normal.  Eyes:     Extraocular Movements: Extraocular movements intact.     Pupils: Pupils are equal, round, and reactive to light.  Cardiovascular:     Rate and Rhythm: Normal rate and regular rhythm.     Pulses: Normal pulses.     Heart sounds: Normal heart sounds. No murmur heard.    No friction rub. No gallop.  Pulmonary:     Effort: Pulmonary effort is normal. No respiratory distress.     Breath sounds: Normal breath sounds. No stridor.  No wheezing, rhonchi or rales.  Abdominal:     General: Bowel sounds are normal.     Palpations: Abdomen is soft.  Neurological:     General: No focal deficit present.     Mental Status: She is alert and oriented to person, place, and time. Mental status is at baseline.     Cranial Nerves: No cranial nerve deficit.     Sensory: No sensory deficit.     Motor: No weakness.     Coordination: Coordination normal.     Gait: Gait normal.     Deep Tendon Reflexes: Reflexes normal.           Assessment & Plan:  Colon cancer screening - Plan: Cologuard  General medical exam Physical exam today is normal.  Lab work is acceptable.  I recommended a low-fat diet rich in fruits and vegetables as well as 30 minutes a day 5 days a week of aerobic exercise.  The patient declines a flu shot.  We did discuss the RSV shot.  She is around infants.  She will consider this.  Tetanus shot is up-to-date.  She is due for colon cancer screening.  We discussed her options and she chooses to proceed with Cologuard.  Mammogram and Pap smear are up-to-date.  I believe phentermine would be a better option for this patient.  We did discuss Ozempic/Wegovy.  Patient will check with her insurance to see if it is  covered.

## 2023-09-06 DIAGNOSIS — Z1211 Encounter for screening for malignant neoplasm of colon: Secondary | ICD-10-CM | POA: Diagnosis not present

## 2023-09-12 LAB — COLOGUARD: COLOGUARD: NEGATIVE

## 2023-09-18 DIAGNOSIS — S93402A Sprain of unspecified ligament of left ankle, initial encounter: Secondary | ICD-10-CM | POA: Diagnosis not present

## 2023-09-18 DIAGNOSIS — S8265XA Nondisplaced fracture of lateral malleolus of left fibula, initial encounter for closed fracture: Secondary | ICD-10-CM | POA: Diagnosis not present

## 2023-10-02 DIAGNOSIS — S93402D Sprain of unspecified ligament of left ankle, subsequent encounter: Secondary | ICD-10-CM | POA: Diagnosis not present

## 2023-10-23 IMAGING — MG MM DIGITAL DIAGNOSTIC UNILAT*L* W/ TOMO W/ CAD
4 series · 4 of 12 positions shown · non-contrast
Comparison: Previous exams.

CLINICAL DATA: Screening recall for left breast asymmetry.

EXAM:
DIGITAL DIAGNOSTIC UNILATERAL LEFT MAMMOGRAM WITH TOMOSYNTHESIS AND
CAD
TECHNIQUE: Left digital diagnostic mammography and breast tomosynthesis was
performed. The images were evaluated with computer-aided detection.

[L CC synth-2D]
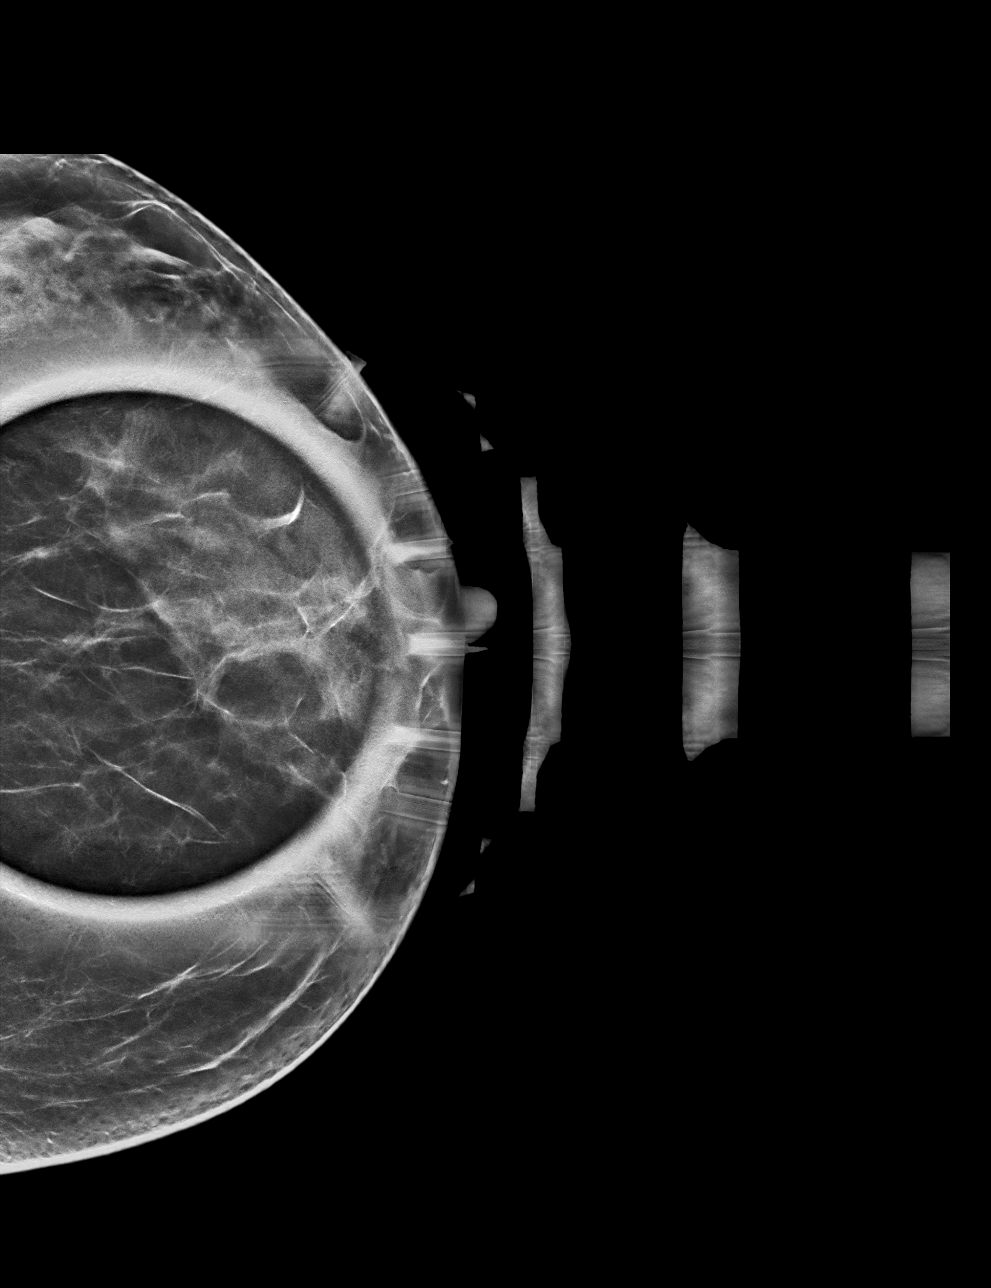

[L ML synth-2D]
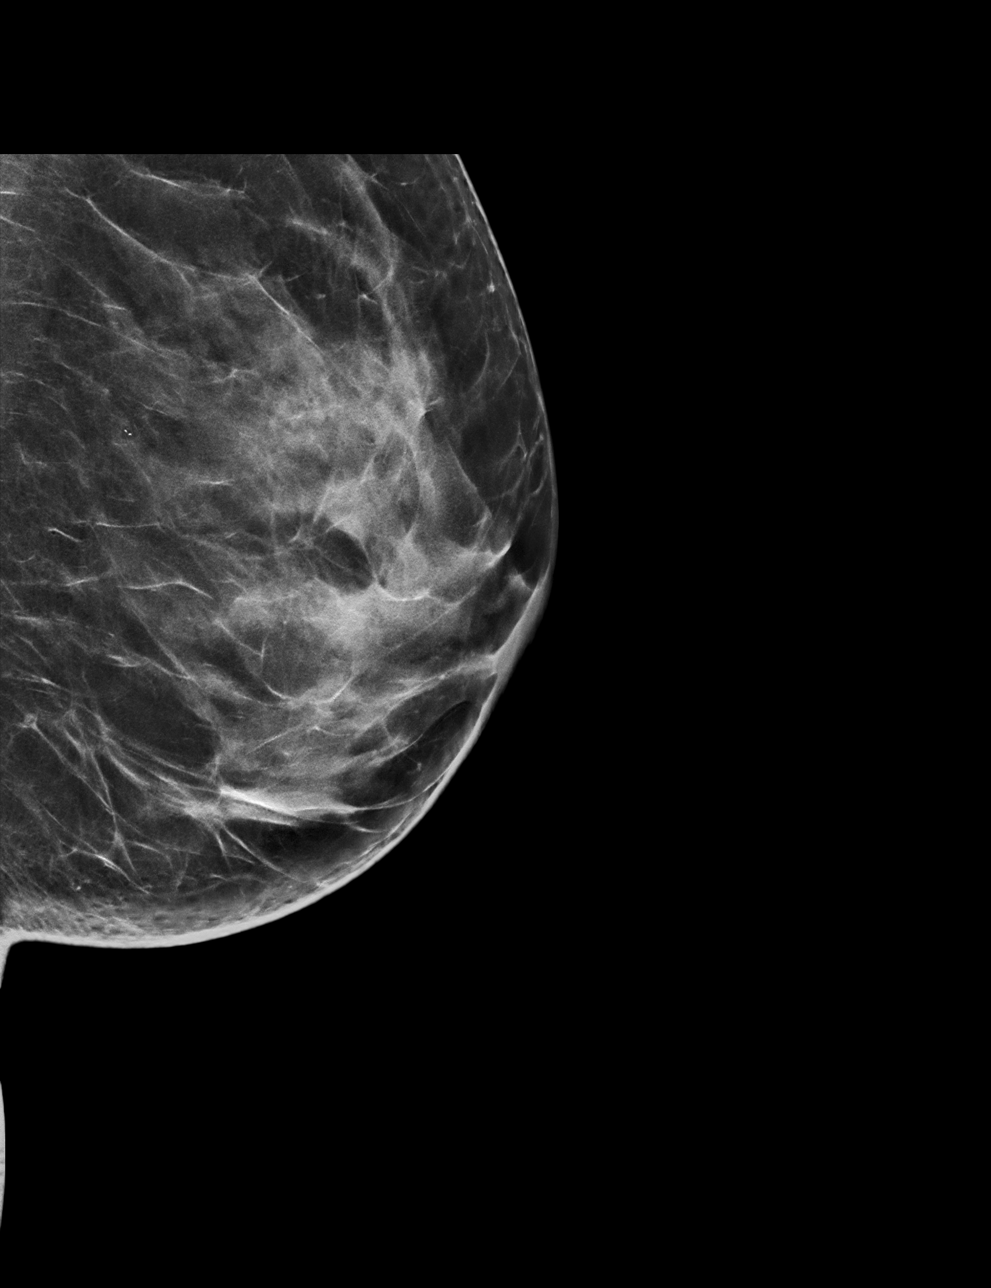

[L CC tomo · tomo slice 29/58.0]
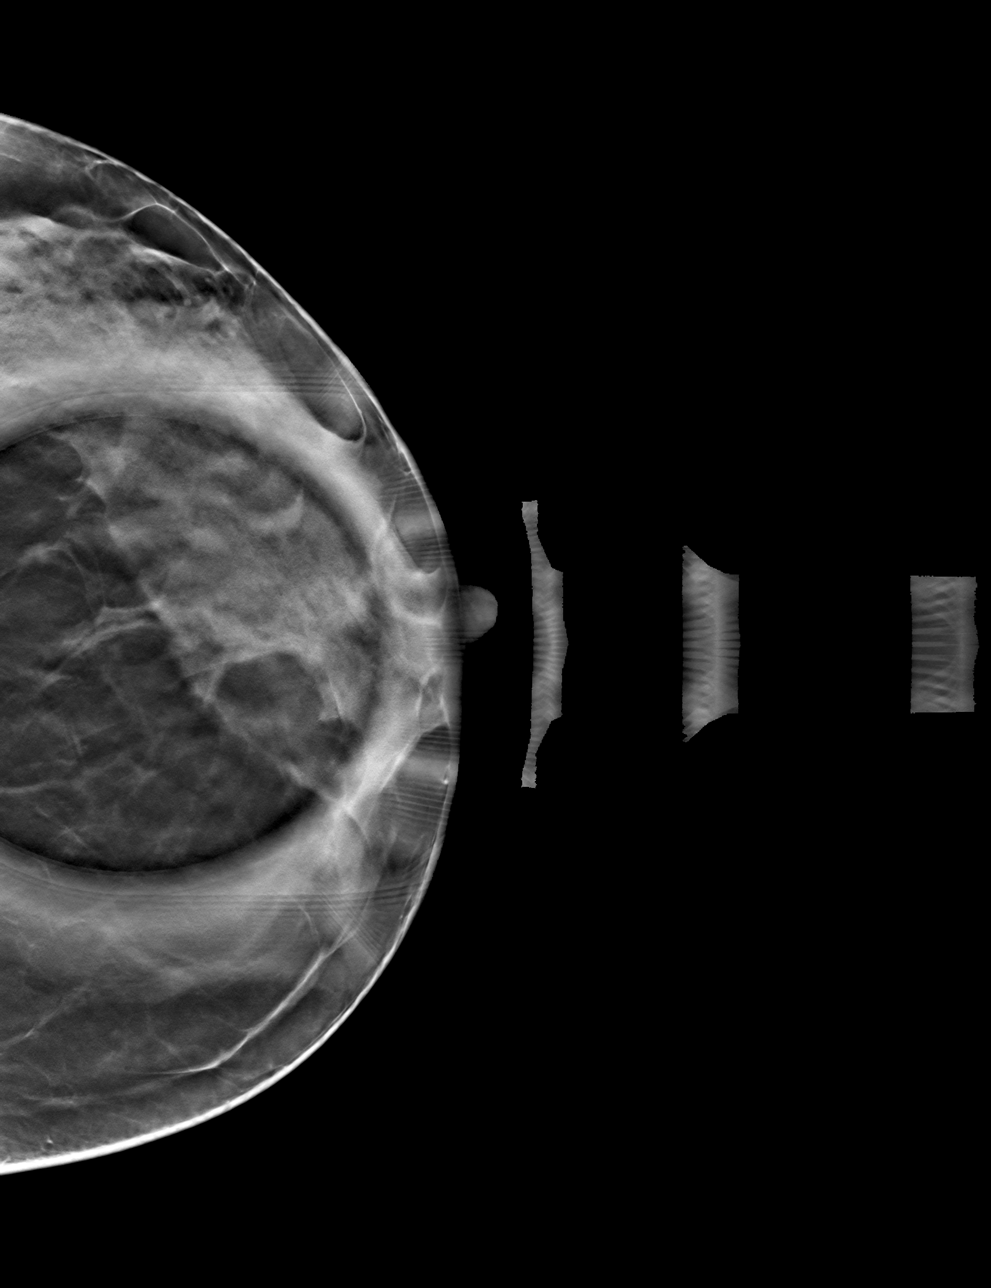

[L ML tomo · tomo slice 35/70.0]
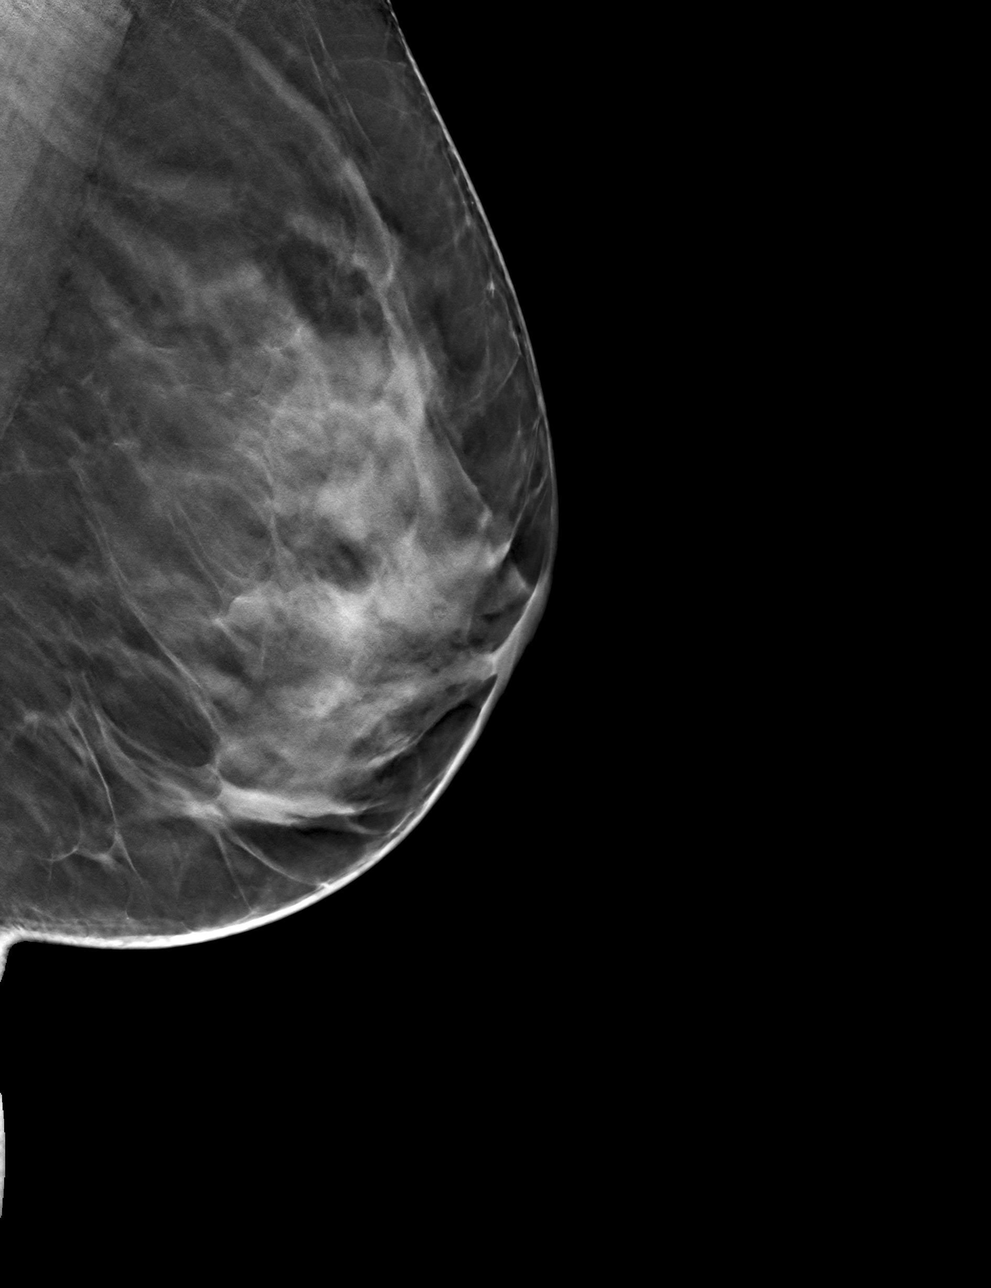

[4 of 12 positions shown; findings below may reference images not displayed]

ACR Breast Density Category c: The breast tissue is heterogeneously
dense, which may obscure small masses.
FINDINGS: Additional tomograms were performed of the left breast. The
initially questioned left breast on the additional imaging with
findings compatible with an area of overlapping fibroglandular
tissue. There is no mammographic evidence of malignancy in the left
breast.
IMPRESSION: No mammographic evidence of malignancy in the left breast.

RECOMMENDATION:
Screening mammogram in one year.(Code:34-S-XD0)

I have discussed the findings and recommendations with the patient.
If applicable, a reminder letter will be sent to the patient
regarding the next appointment.

BI-RADS CATEGORY  1: Negative.

## 2023-12-18 ENCOUNTER — Ambulatory Visit: Payer: BC Managed Care – PPO | Admitting: Obstetrics & Gynecology

## 2023-12-18 ENCOUNTER — Other Ambulatory Visit (HOSPITAL_COMMUNITY)
Admission: RE | Admit: 2023-12-18 | Discharge: 2023-12-18 | Disposition: A | Discharge status: Home / Self Care | Payer: BC Managed Care – PPO | Source: Ambulatory Visit | Attending: Obstetrics & Gynecology | Admitting: Obstetrics & Gynecology

## 2023-12-18 ENCOUNTER — Encounter: Payer: Self-pay | Admitting: Obstetrics & Gynecology

## 2023-12-18 VITALS — BP 134/82 | HR 90 | Ht 64.0 in | Wt 165.0 lb

## 2023-12-18 DIAGNOSIS — F341 Dysthymic disorder: Secondary | ICD-10-CM

## 2023-12-18 DIAGNOSIS — N951 Menopausal and female climacteric states: Secondary | ICD-10-CM | POA: Diagnosis not present

## 2023-12-18 DIAGNOSIS — Z01419 Encounter for gynecological examination (general) (routine) without abnormal findings: Secondary | ICD-10-CM | POA: Insufficient documentation

## 2023-12-18 MED ORDER — CONJ ESTROGENS-BAZEDOXIFENE 0.45-20 MG PO TABS: 1.0000 | ORAL_TABLET | Freq: Every day | ORAL | 3 refills | Status: DC

## 2023-12-18 NOTE — Addendum Note (Signed)
Addended by: Lazaro Arms on: 12/18/2023 09:17 AM   Modules accepted: Orders

## 2023-12-18 NOTE — Progress Notes (Signed)
Subjective:     Mia Harris is a 48 y.o. female here for a routine exam.  Patient's last menstrual period was 12/15/2023. [id number] Birth Control Method:  vasectomy Menstrual Calendar(currently): irregular  Current complaints: vasomotor symptoms, irregular menses.   Current acute medical issues:     Recent Gynecologic History Patient's last menstrual period was 12/15/2023. Last Pap: 10/2023,  normal Last mammogram: 06/2023,  normal  Past Medical History:  Diagnosis Date   Abnormal Pap smear    abnormal pap   Depression     History reviewed. No pertinent surgical history.  OB History     Gravida  3   Para  2   Term  2   Preterm      AB  1   Living  2      SAB  1   IAB      Ectopic      Multiple      Live Births  2           Social History   Socioeconomic History   Marital status: Married    Spouse name: Not on file   Number of children: 2   Years of education: Not on file   Highest education level: Not on file  Occupational History   Not on file  Tobacco Use   Smoking status: Former    Types: Cigarettes   Smokeless tobacco: Never  Vaping Use   Vaping status: Never Used  Substance and Sexual Activity   Alcohol use: Yes    Comment: once in a while   Drug use: No   Sexual activity: Yes    Birth control/protection: Surgical    Comment: husband had vasectomy  Other Topics Concern   Not on file  Social History Narrative   Not on file   Social Drivers of Health   Financial Resource Strain: Low Risk  (10/31/2022)   Overall Financial Resource Strain (CARDIA)    Difficulty of Paying Living Expenses: Not hard at all  Food Insecurity: No Food Insecurity (10/31/2022)   Hunger Vital Sign    Worried About Running Out of Food in the Last Year: Never true    Ran Out of Food in the Last Year: Never true  Transportation Needs: No Transportation Needs (10/31/2022)   PRAPARE - Administrator, Civil Service (Medical): No    Lack of  Transportation (Non-Medical): No  Physical Activity: Sufficiently Active (02/18/2021)   Exercise Vital Sign    Days of Exercise per Week: 5 days    Minutes of Exercise per Session: 100 min  Stress: No Stress Concern Present (10/31/2022)   [employer] of Occupational Health - Occupational Stress Questionnaire    Feeling of Stress : Only a little  Social Connections: Moderately Integrated (10/31/2022)   Social Connection and Isolation Panel [NHANES]    Frequency of Communication with Friends and Family: Three times a week    Frequency of Social Gatherings with Friends and Family: Not on file    Attends Religious Services: More than 4 times per year    Active Member of [employer] or Organizations: No    Attends Banker Meetings: Never    Marital Status: Married    Family History  Problem Relation Age of Onset   Cancer - Lung Maternal Grandmother    Endometriosis Mother    Cancer - Lung Maternal Aunt    Asthma Daughter    Colitis Daughter    Seizures Son  Current Outpatient Medications:    Calcium-Vitamin D-Vitamin K (VIACTIV PO), Take by mouth., Disp: , Rfl:    Conj Estrogens-Bazedoxifene 0.45-20 MG TABS, Take 1 tablet by mouth daily. Take 1 tablet daily, Disp: 90 tablet, Rfl: 3   Conj Estrogens-Bazedoxifene 0.45-20 MG TABS, Take 1 tablet by mouth daily. Take 1 tablet daily, Disp: 90 tablet, Rfl: 3   Omega-3 Fatty Acids (FISH OIL) 1000 MG CAPS, Take by mouth., Disp: , Rfl:    b complex vitamins capsule, Take 1 capsule by mouth daily. (Patient not taking: Reported on 12/18/2023), Disp: , Rfl:    EVENING PRIMROSE OIL PO, Take by mouth. (Patient not taking: Reported on 12/18/2023), Disp: , Rfl:    Multiple Vitamin (MULTIVITAMIN) tablet, Take 1 tablet by mouth daily. (Patient not taking: Reported on 12/18/2023), Disp: , Rfl:    tranexamic acid (LYSTEDA) 650 MG TABS tablet, Take 325 mg by mouth 2 (two) times daily. (Patient not taking: Reported on 12/18/2023), Disp: , Rfl:    Review of Systems  Review of Systems  Constitutional: Negative for fever, chills, weight loss, malaise/fatigue and diaphoresis.  HENT: Negative for hearing loss, ear pain, nosebleeds, congestion, sore throat, neck pain, tinnitus and ear discharge.   Eyes: Negative for blurred vision, double vision, photophobia, pain, discharge and redness.  Respiratory: Negative for cough, hemoptysis, sputum production, shortness of breath, wheezing and stridor.   Cardiovascular: Negative for chest pain, palpitations, orthopnea, claudication, leg swelling and PND.  Gastrointestinal: negative for abdominal pain. Negative for heartburn, nausea, vomiting, diarrhea, constipation, blood in stool and melena.  Genitourinary: Negative for dysuria, urgency, frequency, hematuria and flank pain.  Musculoskeletal: Negative for myalgias, back pain, joint pain and falls.  Skin: Negative for itching and rash.  Neurological: Negative for dizziness, tingling, tremors, sensory change, speech change, focal weakness, seizures, loss of consciousness, weakness and headaches.  Endo/Heme/Allergies: Negative for environmental allergies and polydipsia. Does not bruise/bleed easily.  Psychiatric/Behavioral: Negative for depression, suicidal ideas, hallucinations, memory loss and substance abuse. The patient is not nervous/anxious and does not have insomnia.        Objective:  Blood pressure 134/82, pulse 90, height 5\' 4"  (1.626 m), weight 165 lb (74.8 kg), last menstrual period 12/15/2023.   Physical Exam  Vitals reviewed. Constitutional: She is oriented to person, place, and time. She appears well-developed and well-nourished.  HENT:  Head: Normocephalic and atraumatic.        Right Ear: External ear normal.  Left Ear: External ear normal.  Nose: Nose normal.  Mouth/Throat: Oropharynx is clear and moist.  Eyes: Conjunctivae and EOM are normal. Pupils are equal, round, and reactive to light. Right eye exhibits no discharge.  Left eye exhibits no discharge. No scleral icterus.  Neck: Normal range of motion. Neck supple. No tracheal deviation present. No thyromegaly present.  Cardiovascular: Normal rate, regular rhythm, normal heart sounds and intact distal pulses.  Exam reveals no gallop and no friction rub.   No murmur heard. Respiratory: Effort normal and breath sounds normal. No respiratory distress. She has no wheezes. She has no rales. She exhibits no tenderness.  GI: Soft. Bowel sounds are normal. She exhibits no distension and no mass. There is no tenderness. There is no rebound and no guarding.  Genitourinary:  Breasts no masses skin changes or nipple changes bilaterally      Vulva is normal without lesions Vagina is pink moist without discharge Cervix normal in appearance and pap is done Uterus is normal size shape and contour Adnexa is  negative with normal sized ovaries   Musculoskeletal: Normal range of motion. She exhibits no edema and no tenderness.  Neurological: She is alert and oriented to person, place, and time. She has normal reflexes. She displays normal reflexes. No cranial nerve deficit. She exhibits normal muscle tone. Coordination normal.  Skin: Skin is warm and dry. No rash noted. No erythema. No pallor.  Psychiatric: She has a normal mood and affect. Her behavior is normal. Judgment and thought content normal.       Medications Ordered at today's visit: Meds ordered this encounter  Medications   Conj Estrogens-Bazedoxifene 0.45-20 MG TABS    Sig: Take 1 tablet by mouth daily. Take 1 tablet daily    Dispense:  90 tablet    Refill:  3   Conj Estrogens-Bazedoxifene 0.45-20 MG TABS    Sig: Take 1 tablet by mouth daily. Take 1 tablet daily    Dispense:  90 tablet    Refill:  3  2 RX to see which is cheaper for pt  Other orders placed at today's visit: No orders of the defined types were placed in this encounter.    ASSESSMENT + PLAN:    ICD-10-CM   1. Well woman exam with  routine gynecological exam  Z01.419 Cytology - PAP( [facility])    2. Perimenopausal symptoms  N95.1    begin Duavee          Return in about 1 year (around 12/17/2024) for yearly)pt prefers yearly evals).

## 2023-12-19 ENCOUNTER — Encounter: Payer: Self-pay | Admitting: Obstetrics & Gynecology

## 2023-12-19 LAB — THYROID PANEL WITH TSH
Free Thyroxine Index: 1.9 (ref 1.2–4.9)
T3 Uptake Ratio: 25 % (ref 24–39)
T4, Total: 7.6 ug/dL (ref 4.5–12.0)
TSH: 2.62 u[IU]/mL (ref 0.450–4.500)

## 2023-12-21 ENCOUNTER — Encounter: Payer: Self-pay | Admitting: Obstetrics & Gynecology

## 2023-12-21 LAB — CYTOLOGY - PAP
Comment: NEGATIVE
Diagnosis: NEGATIVE
High risk HPV: NEGATIVE

## 2024-01-09 ENCOUNTER — Encounter: Payer: Self-pay | Admitting: Family Medicine

## 2024-03-12 ENCOUNTER — Encounter: Payer: Self-pay | Admitting: Family Medicine

## 2024-03-19 ENCOUNTER — Other Ambulatory Visit: Payer: Self-pay | Admitting: Family Medicine

## 2024-03-19 MED ORDER — PREMPRO 0.45-1.5 MG PO TABS: 1.0000 | ORAL_TABLET | Freq: Every day | ORAL | 5 refills | Status: AC

## 2024-05-20 ENCOUNTER — Encounter: Payer: Self-pay | Admitting: Family Medicine

## 2024-06-07 ENCOUNTER — Encounter: Payer: Self-pay | Admitting: Family Medicine

## 2024-06-07 ENCOUNTER — Ambulatory Visit: Admitting: Family Medicine

## 2024-06-07 VITALS — BP 121/80 | HR 78 | Temp 97.5°F | Ht 64.0 in | Wt 154.0 lb

## 2024-06-07 DIAGNOSIS — F330 Major depressive disorder, recurrent, mild: Secondary | ICD-10-CM | POA: Diagnosis not present

## 2024-06-07 NOTE — Progress Notes (Signed)
 Subjective:    Patient ID: Mia Harris, female    DOB: 1976/06/14, 48 y.o.   MRN: [id number]  HPI Patient is being seen today for more consultation.  Patient was no longer having regular periods.  She is experiencing hot flashes.  She also reports depression, trouble focusing, and anxiety.  She tried hormone replacement therapy for over a month.  This helped with hot flashes.  However it did not help with mood symptoms especially the anxiety and depression.  She is interested in medication to help with depression.  Her biggest concern seems to be overthinking.  She perseverates over fears and anxieties at night while sleeping.  She also has trouble concentrating and trouble focusing. Past Medical History:  Diagnosis Date   Abnormal Pap smear    abnormal pap   Depression    History reviewed. No pertinent surgical history. Current Outpatient Medications on File Prior to Visit  Medication Sig Dispense Refill   Calcium-Vitamin D-Vitamin K (VIACTIV PO) Take by mouth.     Multiple Vitamin (MULTIVITAMIN) tablet Take 1 tablet by mouth daily.     Omega-3 Fatty Acids (FISH OIL) 1000 MG CAPS Take by mouth.     estrogen, conjugated,-medroxyprogesterone (PREMPRO ) 0.45-1.5 MG tablet Take 1 tablet by mouth daily. Stop other form of hrt (Patient not taking: Reported on 06/07/2024) 30 tablet 5   No current facility-administered medications on file prior to visit.   Allergies  Allergen Reactions   Codeine Rash   Penicillins Itching and Rash   Social History   Socioeconomic History   Marital status: Married    Spouse name: Not on file   Number of children: 2   Years of education: Not on file   Highest education level: Not on file  Occupational History   Not on file  Tobacco Use   Smoking status: Former    Types: Cigarettes   Smokeless tobacco: Never  Vaping Use   Vaping status: Never Used  Substance and Sexual Activity   Alcohol use: Yes    Comment: once in a while   Drug use: No    Sexual activity: Yes    Birth control/protection: Surgical    Comment: husband had vasectomy  Other Topics Concern   Not on file  Social History Narrative   Not on file   Social Drivers of Health   Financial Resource Strain: Low Risk  (10/31/2022)   Overall Financial Resource Strain (CARDIA)    Difficulty of Paying Living Expenses: Not hard at all  Food Insecurity: No Food Insecurity (10/31/2022)   Hunger Vital Sign    Worried About Running Out of Food in the Last Year: Never true    Ran Out of Food in the Last Year: Never true  Transportation Needs: No Transportation Needs (10/31/2022)   PRAPARE - Administrator, Civil Service (Medical): No    Lack of Transportation (Non-Medical): No  Physical Activity: Sufficiently Active (02/18/2021)   Exercise Vital Sign    Days of Exercise per Week: 5 days    Minutes of Exercise per Session: 100 min  Stress: No Stress Concern Present (10/31/2022)   [employer] of Occupational Health - Occupational Stress Questionnaire    Feeling of Stress : Only a little  Social Connections: Moderately Integrated (10/31/2022)   Social Connection and Isolation Panel    Frequency of Communication with Friends and Family: Three times a week    Frequency of Social Gatherings with Friends and Family: Not on file  Attends Religious Services: More than 4 times per year    Active Member of Clubs or Organizations: No    Attends Banker Meetings: Never    Marital Status: Married  Catering manager Violence: Not At Risk (10/31/2022)   Humiliation, Afraid, Rape, and Kick questionnaire    Fear of Current or Ex-Partner: No    Emotionally Abused: No    Physically Abused: No    Sexually Abused: No      Review of Systems     Objective:   Physical Exam Constitutional:      Appearance: Normal appearance. She is normal weight.  HENT:     Right Ear: Tympanic membrane and ear canal normal.     Left Ear: Tympanic membrane and ear canal  normal.  Eyes:     Extraocular Movements: Extraocular movements intact.     Pupils: Pupils are equal, round, and reactive to light.  Cardiovascular:     Rate and Rhythm: Normal rate and regular rhythm.     Pulses: Normal pulses.     Heart sounds: Normal heart sounds. No murmur heard.    No friction rub. No gallop.  Pulmonary:     Effort: Pulmonary effort is normal. No respiratory distress.     Breath sounds: Normal breath sounds. No stridor. No wheezing, rhonchi or rales.  Abdominal:     General: Bowel sounds are normal.     Palpations: Abdomen is soft.  Neurological:     General: No focal deficit present.     Mental Status: She is alert and oriented to person, place, and time. Mental status is at baseline.     Cranial Nerves: No cranial nerve deficit.     Sensory: No sensory deficit.     Motor: No weakness.     Coordination: Coordination normal.     Gait: Gait normal.     Deep Tendon Reflexes: Reflexes normal.           Assessment & Plan:  Mild episode of recurrent major depressive disorder (HCC) We will start Trintellix 5 mg a day and then uptitrate to 10 mg in 1 week.  Reassess in 1 month

## 2024-09-19 ENCOUNTER — Encounter: Payer: Self-pay | Admitting: Family Medicine

## 2024-09-19 ENCOUNTER — Other Ambulatory Visit: Payer: Self-pay | Admitting: Family Medicine

## 2024-09-19 MED ORDER — VORTIOXETINE HBR 10 MG PO TABS: 10.0000 mg | ORAL_TABLET | Freq: Every day | ORAL | 3 refills | Status: AC

## 2024-10-04 DIAGNOSIS — R232 Flushing: Secondary | ICD-10-CM | POA: Diagnosis not present

## 2024-11-19 DIAGNOSIS — E663 Overweight: Secondary | ICD-10-CM | POA: Diagnosis not present

## 2024-11-19 DIAGNOSIS — R232 Flushing: Secondary | ICD-10-CM | POA: Diagnosis not present
